# Patient Record
Sex: Female | Born: 1995 | Race: Black or African American | Hispanic: No | Marital: Single | Smoking: Never smoker
Health system: Southern US, Community
[De-identification: ages and names within clinical notes are randomized; demographics above are authoritative.]

## PROBLEM LIST (undated history)

## (undated) DIAGNOSIS — M352 Behcet's disease: Secondary | ICD-10-CM

## (undated) DIAGNOSIS — R519 Headache, unspecified: Secondary | ICD-10-CM

## (undated) DIAGNOSIS — R51 Headache: Secondary | ICD-10-CM

## (undated) DIAGNOSIS — A6920 Lyme disease, unspecified: Secondary | ICD-10-CM

## (undated) DIAGNOSIS — M797 Fibromyalgia: Secondary | ICD-10-CM

## (undated) DIAGNOSIS — M940 Chondrocostal junction syndrome [Tietze]: Secondary | ICD-10-CM

## (undated) DIAGNOSIS — M1990 Unspecified osteoarthritis, unspecified site: Secondary | ICD-10-CM

## (undated) HISTORY — DX: Headache: R51

## (undated) HISTORY — DX: Headache, unspecified: R51.9

---
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
## 2017-01-17 ENCOUNTER — Encounter: Payer: Self-pay | Admitting: Pediatrics

## 2017-01-30 ENCOUNTER — Encounter: Payer: Self-pay | Admitting: Pediatrics

## 2017-04-03 ENCOUNTER — Ambulatory Visit (INDEPENDENT_AMBULATORY_CARE_PROVIDER_SITE_OTHER): Admitting: Pediatrics

## 2017-04-03 ENCOUNTER — Encounter: Payer: Self-pay | Admitting: Pediatrics

## 2017-04-03 VITALS — BP 122/81 | HR 83 | Ht 58.27 in | Wt 108.0 lb

## 2017-04-03 DIAGNOSIS — N898 Other specified noninflammatory disorders of vagina: Secondary | ICD-10-CM

## 2017-04-03 DIAGNOSIS — L298 Other pruritus: Secondary | ICD-10-CM | POA: Diagnosis not present

## 2017-04-03 DIAGNOSIS — R358 Other polyuria: Secondary | ICD-10-CM

## 2017-04-03 DIAGNOSIS — Z113 Encounter for screening for infections with a predominantly sexual mode of transmission: Secondary | ICD-10-CM

## 2017-04-03 DIAGNOSIS — Z3202 Encounter for pregnancy test, result negative: Secondary | ICD-10-CM | POA: Diagnosis not present

## 2017-04-03 DIAGNOSIS — Z130 Encounter for screening for diseases of the blood and blood-forming organs and certain disorders involving the immune mechanism: Secondary | ICD-10-CM

## 2017-04-03 DIAGNOSIS — D500 Iron deficiency anemia secondary to blood loss (chronic): Secondary | ICD-10-CM

## 2017-04-03 DIAGNOSIS — N921 Excessive and frequent menstruation with irregular cycle: Secondary | ICD-10-CM | POA: Diagnosis not present

## 2017-04-03 DIAGNOSIS — R3589 Other polyuria: Secondary | ICD-10-CM

## 2017-04-03 LAB — TSH: TSH: 1.18 m[IU]/L (ref 0.50–4.30)

## 2017-04-03 LAB — T4, FREE: FREE T4: 1.2 ng/dL (ref 0.8–1.4)

## 2017-04-03 LAB — POCT URINALYSIS DIPSTICK
Bilirubin, UA: NEGATIVE
Glucose, UA: NEGATIVE
Ketones, UA: NEGATIVE
NITRITE UA: NEGATIVE
PH UA: 5 (ref 5.0–8.0)
PROTEIN UA: NEGATIVE
Spec Grav, UA: 1.02 (ref 1.030–1.035)
Urobilinogen, UA: NEGATIVE (ref ?–2.0)

## 2017-04-03 LAB — PLATELET FUNCTION ASSAY: COLLAGEN / EPINEPHRINE: 167 s (ref 0–193)

## 2017-04-03 LAB — POCT HEMOGLOBIN: Hemoglobin: 9.3 g/dL — AB (ref 12.2–16.2)

## 2017-04-03 LAB — POCT URINE PREGNANCY: Preg Test, Ur: NEGATIVE

## 2017-04-03 LAB — POCT RAPID HIV: Rapid HIV, POC: NEGATIVE

## 2017-04-03 NOTE — Patient Instructions (Signed)
We will get further information today about your periods through labs and getting more information from your primary care doctor  Come back and see [place] in 1 month or sooner if your bleeding gets heavier

## 2017-04-03 NOTE — Progress Notes (Signed)
THIS RECORD MAY CONTAIN CONFIDENTIAL INFORMATION THAT SHOULD NOT BE RELEASED WITHOUT REVIEW OF THE SERVICE PROVIDER.  Adolescent Medicine Consultation Initial Visit Susan George  is a 21 y.o. female referred by No ref. provider found here today for evaluation of irregular menses.      - Review of records?  yes  - Pertinent Labs? No  Growth Chart Viewed? yes   History was provided by the patient.  PCP Confirmed?  yes  My Chart Activated?   yes     Chief Complaint  Patient presents with  . New Evaluation  . reproductive health    HPI:    Since May of last year has been bleeding. Had started on depo shot (had 3 rounds) because thought periods weren't helping iron levels and didn't have period for a few months. After May it kept going. Was put on OCP and then estrogen alone but it didn't stop bleeding and she had chest pain with it and had an elevated d-dimer? but did not have clots.   Saw GYN and they didn't have much to offer with help. She is still bleeding every day although not as heavy. GYN said he didn't think that after ultrasound Mirena would be appropriate choice for her. End of last year. At [facility].   Menarche at 54. Cycles have never been regular. She used to bleed very heavily and has bled through clothes multiple times in her life.   Has still had lots of mouth ulcers. She had to have EMS come recently due to pain. She is still in school at [employer] and is taking a lighter load.   Never been sexually active. 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 Has been exclusively using pads.   Was seeing therapist on campus about twice a month but hasn't been since January. Spent 4 months at [facility] getting strong enough to walk again after she was ill in 11th grade-- reports she had Lyme disease.   No LMP recorded. Patient is not currently having periods (Reason: Irregular  Periods).  Review of Systems  Constitutional: Positive for fatigue.  HENT: Positive for mouth sores and postnasal drip.   Respiratory: Positive for shortness of breath.   Cardiovascular: Positive for chest pain. Negative for palpitations.  Gastrointestinal: Positive for abdominal pain, blood in stool and constipation. Negative for nausea and vomiting.  Endocrine: Positive for polyuria.  Genitourinary: Positive for genital sores. Negative for dysuria.  Musculoskeletal: Positive for arthralgias and myalgias.  Neurological: Positive for headaches. Negative for dizziness.    Allergies  Allergen Reactions  . Strawberry Flavor Anaphylaxis   Outpatient Medications Prior to Visit  Medication Sig Dispense Refill  . amitriptyline (ELAVIL) 25 MG tablet Take 25 mg by mouth at bedtime.    . colchicine 0.6 MG tablet Take 1 tablet (0.6 mg total) by mouth 2 (two) times daily. 14 tablet 0  . dexamethasone 0.5 MG/5ML elixir Rinse with 5 mL for 2 minutes and then spit out. Perform 4 times daily until lesions resolve.    . docusate sodium (COLACE) 100 MG capsule Take 100 mg by mouth daily.    . DULoxetine (CYMBALTA) 60 MG capsule Take 60 mg by mouth at bedtime.    . ferrous sulfate 325 (65 FE) MG tablet Take 325 mg by mouth 3 (three) times daily.    Marland Kitchen ibuprofen (ADVIL,MOTRIN) 600 MG tablet Take 600 mg by  mouth every 6 (six) hours as needed for headache or moderate pain.    . magnesium 30 MG tablet Take 30 mg by mouth every evening.    . ondansetron (ZOFRAN-ODT) 4 MG disintegrating tablet Take 4 mg by mouth every 8 (eight) hours as needed for nausea or vomiting.    Marland Kitchen oxyCODONE-acetaminophen (PERCOCET/ROXICET) 5-325 MG tablet Take 2 tablets by mouth every 6 (six) hours as needed for severe pain. 20 tablet 0  . polyethylene glycol (MIRALAX / GLYCOLAX) packet Take 17 g by mouth daily.    . predniSONE (DELTASONE) 20 MG tablet Take 2 tablets (40 mg total) by mouth daily with breakfast. 7 tablet 0  . PREMARIN  0.9 MG tablet Take 0.9 mg by mouth at bedtime.    . vitamin C (ASCORBIC ACID) 500 MG tablet Take 500 mg by mouth 3 (three) times daily.    . Vitamin D, Ergocalciferol, (DRISDOL) 50000 units CAPS capsule Take 50,000 Units by mouth once a week.    . lansoprazole (PREVACID) 30 MG capsule Take 60 mg by mouth daily at 12 noon.     No facility-administered medications prior to visit.      Patient Active Problem List   Diagnosis Date Noted  . Chronic pain syndrome 08/20/2016  . Iron deficiency anemia 08/20/2016  . Menorrhagia 08/20/2016  . Polyarthralgia   . Tachycardia 08/16/2016  . Sinus tachycardia 08/16/2016  . 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 Pain in the joints 08/16/2016  . Back pain 08/16/2016  . Chest pain 08/16/2016  . SIRS (systemic inflammatory response syndrome) (HCC) 08/16/2016    Past Medical History:  Reviewed and updated?  yes Past Medical History:  Diagnosis Date  . Arthritis   . Behcet's syndrome (HCC)   . Costochondritis   . Fibromyalgia   . Lyme disease     Family History: Reviewed and updated? Yes No family history on file.  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  Tobacco?  no Drugs/ETOH?  no Partner preference?  female Sexually Active?  no  Pregnancy Prevention:  condoms, reviewed condoms & plan B Trauma currently or in the pastt?  yes, chronic medical issues throughout high school and continued  Suicidal or Self-Harm thoughts?   no  The following portions of the patient's history were reviewed and updated as appropriate: allergies, current medications, past family history, past medical history, past social history and problem list.  Physical Exam:  Vitals:   04/03/17  0826  BP: 122/81  Pulse: 83  Weight: 108 lb (49 kg)  Height: 4' 10.27" (1.48 m)   BP 122/81 (BP Location: Right Arm, Patient Position: Sitting, Cuff Size: Normal)   Pulse 83   Ht 4' 10.27" (1.48 m)   Wt 108 lb (49 kg)   BMI 22.36 kg/m  Body mass index: body mass index is 22.36 kg/m. Blood pressure percentiles are 92 % systolic and 95 % diastolic based on NHBPEP's 4th Report. Blood pressure percentile targets: 90: 121/77, 95: 124/81, 99 + 5 mmHg: 137/94.   Physical Exam  Constitutional: She appears well-developed. No distress.  HENT:  Mouth/Throat: Oropharynx is clear and moist. Oral lesions present.  Neck: No thyromegaly present.  Thyroid seems somewhat enlarged but difficult to differentiate  with large strap muscles. Some TTP over isthmus.   Cardiovascular: Normal rate and regular rhythm.   No murmur heard. Pulmonary/Chest: Breath sounds normal.  Abdominal: Soft. She exhibits no mass. There is no tenderness. There is no guarding.  Genitourinary: There is no rash on the right labia. There is no rash on the left labia. There is bleeding in the vagina.  Genitourinary Comments: Normal clitoral size   Musculoskeletal: She exhibits no edema.  Lymphadenopathy:    She has no cervical adenopathy.  Neurological: She is alert.  Skin: Skin is warm. No rash noted.  Psychiatric: She has a normal mood and affect.  Nursing note and vitals reviewed.  Assessment/Plan: 1. Menometrorrhagia Will obtain more complete hormonal and bleeding workup today. Will attempt to get ultrasound results from PCP as she reports they will likely be very hard to get from [facility]. She continues to have light daily bleeding but reports that she isn't sure if she wants to try any other hormonal therapy at this time. Would recommend Mirena but would look at ultrasound results first to ensure no contraindication. Could consider norethindrone alone. Could also consider GnRH agonist if hormonal options are exhausted.  -  Prolactin - LH - Follicle stimulating hormone - Testos,Total,Free and SHBG (Female) - TSH - T4, free - DHEA-sulfate - Platelet function assay - Von Willebrand Factor Multimer  2. Polyuria Negative for glucose or ketones.  - POCT urinalysis dipstick  3. Vaginal itching Will do wet prep to ensure no yeast or BV with ongoing pad use.  - WET PREP BY MOLECULAR PROBE  4. Iron deficiency anemia due to chronic blood loss Hemoglobin improved to 9.3 today. Last ferritin in 11/2016 was 2.7. Will reassess today.  - Ferritin - Platelet function assay - Von Willebrand Factor Multimer  5. Screening for iron deficiency anemia 9.3.  - POCT hemoglobin  6. Pregnancy examination or test, negative result Per protocol. Negative.  - POCT urine pregnancy  7. Routine screening for STI (sexually transmitted infection) Per protocol.  - GC/Chlamydia Probe Amp - POCT Rapid HIV   Follow-up:  4 weeks for further review of labs and ultrasound report   Medical decision-making:  >60 minutes spent face to face with patient with more than 50% of appointment spent discussing diagnosis, management, follow-up, and reviewing of multiple chronic medical conditions, menometrorrhagia, iron deficiency anemia and treatment options.   CC: Dr. Marita Snellen, Dr. Madaline Guthrie

## 2017-04-04 ENCOUNTER — Telehealth: Payer: Self-pay | Admitting: Pediatrics

## 2017-04-04 LAB — FOLLICLE STIMULATING HORMONE: FSH: 4.5 m[IU]/mL

## 2017-04-04 LAB — GC/CHLAMYDIA PROBE AMP
CT PROBE, AMP APTIMA: NOT DETECTED
GC Probe RNA: NOT DETECTED

## 2017-04-04 LAB — FERRITIN: Ferritin: 4 ng/mL — ABNORMAL LOW (ref 6–67)

## 2017-04-04 LAB — DHEA-SULFATE: DHEA SO4: 77 ug/dL (ref 51–321)

## 2017-04-04 LAB — LUTEINIZING HORMONE: LH: 4.8 m[IU]/mL

## 2017-04-04 LAB — PROLACTIN: Prolactin: 6.8 ng/mL

## 2017-04-04 NOTE — Telephone Encounter (Signed)
Call to [facility] to attempt to get records for pelvic ultrasound that patient had performed at [facility]. Had to leave message with the RN-- left voicemail with phone number and clinic fax number.

## 2017-04-05 ENCOUNTER — Telehealth: Payer: Self-pay | Admitting: Pediatrics

## 2017-04-05 LAB — WET PREP BY MOLECULAR PROBE
CANDIDA SPECIES: NOT DETECTED
Gardnerella vaginalis: DETECTED — AB
TRICHOMONAS VAG: NOT DETECTED

## 2017-04-05 LAB — TESTOS,TOTAL,FREE AND SHBG (FEMALE)
Sex Hormone Binding Glob.: 44 nmol/L (ref 17–124)
TESTOSTERONE,FREE: 0.7 pg/mL (ref 0.1–6.4)
TESTOSTERONE,TOTAL,LC/MS/MS: 8 ng/dL (ref 2–45)

## 2017-04-05 NOTE — Telephone Encounter (Signed)
Nurse from [facility] returned call from [facility]. She states that in order for [place] to get records from the pelvic ultrasound that patient had performed at [facility], they will need an ROI signed by the patient faxed to them at [phone].

## 2017-04-07 NOTE — Telephone Encounter (Signed)
Spoke with Patient and let her know that we need her to come in and sign an ROI so we may obtain the reults of her Pelvic US from another office. Patient stated understanding and states she will come in at her earliest convenience.

## 2017-04-07 NOTE — Telephone Encounter (Signed)
Received another call from RN stating they need signed ROI.Routing to pod pool.

## 2017-04-10 LAB — VON WILLEBRAND FACTOR MULTIMERI
FACTOR-VIII ACTIVITY: 139 % (ref 50–180)
Ristocetin Co-Factor: 76 % (ref 42–200)
VON WILLEBRAND FACTOR AG: 93 % (ref 50–217)

## 2017-04-13 ENCOUNTER — Other Ambulatory Visit: Payer: Self-pay | Admitting: Pediatrics

## 2017-04-13 ENCOUNTER — Encounter: Payer: Self-pay | Admitting: Pediatrics

## 2017-04-13 MED ORDER — METRONIDAZOLE 500 MG PO TABS: 500.0000 mg | ORAL_TABLET | Freq: Two times a day (BID) | ORAL | 0 refills | Status: DC

## 2017-05-02 ENCOUNTER — Encounter: Payer: Self-pay | Admitting: Pediatrics

## 2017-05-02 ENCOUNTER — Ambulatory Visit (INDEPENDENT_AMBULATORY_CARE_PROVIDER_SITE_OTHER): Admitting: Pediatrics

## 2017-05-02 VITALS — BP 139/85 | HR 102 | Ht 58.47 in | Wt 105.2 lb

## 2017-05-02 DIAGNOSIS — N921 Excessive and frequent menstruation with irregular cycle: Secondary | ICD-10-CM | POA: Diagnosis not present

## 2017-05-02 DIAGNOSIS — D500 Iron deficiency anemia secondary to blood loss (chronic): Secondary | ICD-10-CM | POA: Insufficient documentation

## 2017-05-02 DIAGNOSIS — K5900 Constipation, unspecified: Secondary | ICD-10-CM

## 2017-05-02 LAB — POCT HEMOGLOBIN: HEMOGLOBIN: 9.2 g/dL — AB (ref 12.2–16.2)

## 2017-05-02 NOTE — Assessment & Plan Note (Signed)
Chronic constipation in setting of narcotic use. She does have miralax and colace, and was counseled to take them as prescribed as she has not ben doing so

## 2017-05-02 NOTE — Assessment & Plan Note (Signed)
Hgb 9.2 today from 9.3 at last check. Continues to be anemic in the setting of menorrhagia.

## 2017-05-02 NOTE — Assessment & Plan Note (Signed)
Continue ferrous sulfate TID given continued blood loss from menorrhagia

## 2017-05-02 NOTE — Assessment & Plan Note (Addendum)
Menometrorrhagia - continued daily bleeding in the setting of not taking any hormonal medications. She was previously managed with OCPs in past and there was some concern that they flared her fibromyalgia. Hgb 9.2 today with continued dizziness and fatigue. She had a pelvic US in 11/2016 at [facility], the results of which we awaiting. A progesterone IUD like Mirena would appear to be a good option to control her bleeding assuming she does not have an anatomical contraindication to its placement. She does have significant anxiety surrounding its placement given she is virginal and has a history of a difficult experience with pelvic exams surrounding previous vaginal manifestations of her Bechets. We will wait for the US results and if wnl will [facility] for [facility]rena [facility] under sedation. This has been discussed with the patient and her mother who was present and they are in agreenment

## 2017-05-02 NOTE — Patient Instructions (Signed)
We will get you connected with Dr. Providence LaniusHowell at [facility] for IUD placement under sedation. I will coordinate this with Dr. Marina GoodellPerry this week. I will call you if anything unexpected is on the ultrasound.  [phone]) [phone]

## 2017-05-02 NOTE — Progress Notes (Signed)
10:45 AM  Adolescent Medicine Consultation Follow-Up Visit Susan George  is a 21 y.o. female referred by no referring provider found here today for follow-up of menometrorrhagia     History was provided by the patient and mother.   Growth Chart Viewed? yes  HPI:  Pt reports continued daily bleeding. She is not currently on a hormonal method to control bleeding. Her bleeding has varying degrees of heaviness. Yesterday she felt she had relatively light bleeding and had to change her pad every 2 hours. She uses super maxi pad sand when she changes her pads they are full. She reports daily dizziness and fatigue. Her dizziness is worse when she stands quickly. Two weeks ago after standing quickly she developed dizziness and fell. She is not sure if she lost consciousness.  She also reports abdominal pain that changes location which is chronic in nature, however she does have constipation chronically as well. She typically has a bowel movement every 2-3 days. Her last bowel movement was 5 days ago. She does take percocet for her fibromyalgia. She has miralax an colace which she occasionally forgets to take  No LMP recorded. Patient is not currently having periods (Reason: Irregular Periods).  ROS:   Constitutional: Positive for fatigue.  HENT: negative for post nasal drip Respiratory: negtaive for shortness of breath.   Cardiovascular: Positive for chronic chest pain. Negative for palpitations.  Gastrointestinal: Positive for abdominal pain, and constipation and nausea. Negative for vomiting.  Genitourinary: Negative for genital sores. Negative for dysuria.  Musculoskeletal: Positive for arthralgias and myalgias.  Neurological: Negative for headaches. Positive for dizziness  Allergies  Allergen Reactions  . Strawberry Flavor Anaphylaxis    Social History: Sleep:  Sleeps 7-8 hours per night Eating Habits: no issues with eaating Exercise: taking more walks School: [employer] Future Plans:  Medical school  Confidentiality was discussed with the patient and if applicable, with caregiver as well.  Tobacco? no Secondhand smoke exposure?no Drugs/EtOH?no Sexually active?no Pregnancy Prevention: reviewed condoms Safe at home, in school & in relationships? Yes Guns in the home? no Safe to self? Yes  Physical Exam:  Vitals:   05/02/17 1038  BP: 139/85  Pulse: (!) 102  Weight: 105 lb 3.2 oz (47.7 kg)  Height: 4' 10.47" (1.485 m)   BP 139/85 (BP Location: Right Arm, Patient Position: Sitting, Cuff Size: Small)   Pulse (!) 102   Ht 4' 10.47" (1.485 m)   Wt 105 lb 3.2 oz (47.7 kg)   BMI 21.64 kg/m  Body mass index: body mass index is 21.64 kg/m. Blood pressure percentiles are >99 % systolic and 98 % diastolic based on NHBPEP's 4th Report. Blood pressure percentile targets: 90: 121/77, 95: 124/81, 99 + 5 mmHg: 137/94.  Physical Exam Physical Exam  Constitutional: She appears well-developed. No distress.  HENT:  Mouth/Throat: Oropharynx is clear and moist.  Neck: No thyromegaly present.  Thyroid appears enlarged but difficult to differentiate with large strap muscles. No tenderness to palpation over thyroid Cardiovascular: Normal rate and regular rhythm.   No murmur heard. Pulmonary/Chest: Breath sounds normal.  Abdominal: Soft. She exhibits no mass. There is no tenderness. There is no guarding. .   Musculoskeletal: She exhibits no edema.  Lymphadenopathy:    She has no cervical adenopathy.  Neurological: She is alert.  Skin: Skin is warm. No rash noted.  Psychiatric: She has a normal mood and affect.  Nursing note and vitals reviewed.   Assessment/Plan:  1. Menometrorrhagia - continued daily bleeding in the  setting of not taking any hormonal medications. She was previously managed with OCPs in past and there was some concern that they flared her fibromyalgia. Hgb 9.2 today with continued dizziness and fatigue. She had a pelvic US in 11/2016 at [facility], the results of which we awaiting. A progesterone IUD like Mirena would appear to be a good option to control her bleeding assuming she does not have an anatomical contraindication to its placement. She does have significant anxiety surrounding its placement given she is virginal and has a history of a difficult experience with pelvic exams surrounding previous vaginal manifestations of her Bechets. We will wait for the [place] results and if wnl will refer to [facility] for Mirena placement under sedation. This has been discussed with the patient and her mother who was present and they are in agreenment  2. Anemia due to chronic blood loss Hgb 9.2 today from 9.3 at last check. Continues to be anemic in the setting of menorrhagia.   3. Iron deficiency Continue ferrous sulfate TID given continued blood loss from menorrhagia  4. Chronic Constipation Chronic constipation in setting of narcotic use. She does have miralax and colace, and was counseled to take them as prescribed as she has not ben doing so  Follow-up:  Follow up with [facility] gynecology  Medical decision-making:  > 30 minutes spent, more than 50% of appointment was spent discussing diagnosis and management of symptoms  Joscelin Fray A. Kennon Rounds MD, MS Family Medicine Resident PGY-3 Pager [phone]

## 2017-08-15 ENCOUNTER — Ambulatory Visit (INDEPENDENT_AMBULATORY_CARE_PROVIDER_SITE_OTHER): Admitting: Pediatrics

## 2017-08-15 ENCOUNTER — Encounter: Payer: Self-pay | Admitting: Pediatrics

## 2017-08-15 VITALS — BP 128/81 | HR 122 | Ht 58.07 in | Wt 107.0 lb

## 2017-08-15 DIAGNOSIS — R21 Rash and other nonspecific skin eruption: Secondary | ICD-10-CM | POA: Diagnosis not present

## 2017-08-15 DIAGNOSIS — N921 Excessive and frequent menstruation with irregular cycle: Secondary | ICD-10-CM

## 2017-08-15 DIAGNOSIS — Z130 Encounter for screening for diseases of the blood and blood-forming organs and certain disorders involving the immune mechanism: Secondary | ICD-10-CM | POA: Diagnosis not present

## 2017-08-15 DIAGNOSIS — D500 Iron deficiency anemia secondary to blood loss (chronic): Secondary | ICD-10-CM

## 2017-08-15 DIAGNOSIS — K582 Mixed irritable bowel syndrome: Secondary | ICD-10-CM | POA: Diagnosis not present

## 2017-08-15 DIAGNOSIS — Z975 Presence of (intrauterine) contraceptive device: Secondary | ICD-10-CM | POA: Diagnosis not present

## 2017-08-15 LAB — POCT HEMOGLOBIN: Hemoglobin: 10.5 g/dL — AB (ref 12.2–16.2)

## 2017-08-15 NOTE — Progress Notes (Signed)
THIS RECORD MAY CONTAIN CONFIDENTIAL INFORMATION THAT SHOULD NOT BE RELEASED WITHOUT REVIEW OF THE SERVICE PROVIDER.  Adolescent Medicine Consultation Follow-Up Visit Susan George  is a 21 y.o. female referred by No ref. provider found here today for follow-up regarding IUD placement.    Pertinent Labs? Yes Results for orders placed or performed in visit on 08/15/17  POCT hemoglobin  Result Value Ref Range   Hemoglobin 10.5 (A) 12.2 - 16.2 g/dL  (increased from 9.2 3 months ago)   History was provided by the patient.  Interpreter? no  PCP Confirmed?  yes  My Chart Activated?   yes   Chief Complaint  Patient presents with  . Follow-up    still having irregular bleeding, pt would like left nipple assessed, rash on face    HPI:    Still having bleeding with IUD in place. It's not super heavy but not light. Now soaking half of a pad about every 2 hours. Cramping comes and goes but can be really sharp and extreme. Has taken premarin 0.9 mg previously. Was hesitant to try estrogen containing medication again because of chest pain that occurred with taking the premarin but is willing to consider trying it again at a lower dose.  Rash on face, ?what is causing it, started beginning of August, had been out in the sun Uses charcoal face wash, using cream from dematologist (put it on at night). Concerned that rash might be due ot IUD.    No LMP recorded. Patient is not currently having periods (Reason: Irregular Periods). Allergies  Allergen Reactions  . Strawberry Flavor Anaphylaxis   Outpatient Medications Prior to Visit  Medication Sig Dispense Refill  . amitriptyline (ELAVIL) 25 MG tablet Take 25 mg by mouth at bedtime.    Marland Kitchen dexamethasone 0.5 MG/5ML elixir Rinse with 5 mL for 2 minutes and then spit out. Perform 4 times daily until lesions resolve.    . dexamethasone 0.5 MG/5ML elixir Rinse with 5 mL for 2 minutes and then spit out. Perform 4 times daily until lesions resolve.     . docusate sodium (COLACE) 100 MG capsule Take 100 mg by mouth daily.    . DULoxetine (CYMBALTA) 60 MG capsule Take 60 mg by mouth at bedtime.    . ferrous sulfate 325 (65 FE) MG tablet Take 325 mg by mouth 3 (three) times daily.    . ferrous sulfate 325 (65 FE) MG tablet Take 325 mg by mouth.    Marland Kitchen ibuprofen (ADVIL,MOTRIN) 600 MG tablet Take 600 mg by mouth every 6 (six) hours as needed for headache or moderate pain.    . magnesium 30 MG tablet Take 30 mg by mouth every evening.    . ondansetron (ZOFRAN-ODT) 4 MG disintegrating tablet Take 4 mg by mouth every 8 (eight) hours as needed for nausea or vomiting.    Marland Kitchen oxyCODONE-acetaminophen (PERCOCET/ROXICET) 5-325 MG tablet Take 2 tablets by mouth every 6 (six) hours as needed for severe pain. 20 tablet 0  . polyethylene glycol (MIRALAX / GLYCOLAX) packet Take 17 g by mouth daily.    . vitamin C (ASCORBIC ACID) 500 MG tablet Take 500 mg by mouth 3 (three) times daily.    . Vitamin D, Ergocalciferol, (DRISDOL) 50000 units CAPS capsule Take 50,000 Units by mouth once a week.    . metroNIDAZOLE (FLAGYL) 500 MG tablet Take 1 tablet (500 mg total) by mouth 2 (two) times daily. (Patient not taking: Reported on 08/15/2017) 14 tablet 0   No  facility-administered medications prior to visit.      Patient Active Problem List   Diagnosis Date Noted  . Anemia due to chronic blood loss 05/02/2017  . Constipation 05/02/2017  . Chronic pain syndrome 08/20/2016  . Iron deficiency anemia 08/20/2016  . Menorrhagia 08/20/2016  . Polyarthralgia   . Tachycardia 08/16/2016  . Sinus tachycardia 08/16/2016  . 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 Pain in the joints 08/16/2016  . Back pain 08/16/2016  . Chest pain 08/16/2016  . SIRS (systemic inflammatory response syndrome) (HCC) 08/16/2016    Social History:  Confidentiality was discussed with the patient and if applicable, with caregiver as well.  Changes at home or school since last visit:  no Not  sexually active, no tobacco, drugs or EtOH.   The following portions of the patient's history were reviewed and updated as appropriate: allergies, current medications, past social history and problem list.  Physical Exam:  Vitals:   08/15/17 1525  BP: 128/81  Pulse: (!) 122  Weight: 107 lb (48.5 kg)  Height: 4' 10.07" (1.475 m)   BP 128/81   Pulse (!) 122   Ht 4' 10.07" (1.475 m)   Wt 107 lb (48.5 kg)   BMI 22.31 kg/m  Body mass index: body mass index is 22.31 kg/m. Blood pressure percentiles are 95 % systolic and 96 % diastolic based on the August 2017 AAP Clinical Practice Guideline. Blood pressure percentile targets: 90: 123/76, 95: 128/80, 95 + 12 mmHg: 140/92. This reading is in the Stage 1 hypertension range (BP >= 130/80).  Physical Exam  Constitutional: She appears well-nourished. No distress.  Abdominal: Soft. She exhibits no mass. There is no tenderness. There is no guarding.  Skin: Skin is warm.  Dry patches on face   Assessment/Plan: 21 yo female with complex history of chronic pain, now much improved. Long h/o menorrhagia with slight improvement with IUD in place but still persistent bleeding. Reviewed normal ultrasound yesterday. Reviewed multiple labs in past do not identify endocrinopathy or blood dyscrasia. Discussed with patient whether to consider trial of lower dose of premarin that previously tried. Consider hematology consultation in future given how difficulty bleeding has been to control although no evidence of easy bleeding otherwise. Pt agreed to plan outlined in AVS. - consider trial of diva cup and retrying tampons to limit frequency of pad use - consider low dose OCP (10 mcg EE) or 0.3 mg premarin in future - restart lysteda if recurrence of heavy bleeding - continue iron supplementation  Face rash noted not likely due to IUD. Reassured patient and made some recommendations regarding product use.   Reviewed IBS management.  Discussed relaxation  management and patient plans to consider yoga as well as trying some mental health apps.  Follow-up:  Return in about 1 month (around 09/15/2017) for Follow-up, , with Dr. Marina Goodell.   Medical decision-making:  >30 minutes spent face to face with patient with more than 50% of appointment spent discussing diagnosis, management, follow-up, and reviewing of menorrhagia, face rahs, IBS, stress.

## 2017-08-15 NOTE — Patient Instructions (Addendum)
Consider trying the diva cup or trying tampons again instead of having to change pads every 2 hours.  [email or web address]  Please find out name of birth control pill that you tried previously. We could consider a much lower dose of estrogen or birth control.  Consider taking lysteda again if things get heavier  In terms of the face rash, please send me the name of the cream you have been using. Send that to me through the [facility] my chart.  For now use: Use cetaphil face cleanser (or generic version) twice daily instead of charcoal Apply moisturizer after you cleanse  Consider trying a small amount of miralax every day to avoid the alternating diarrhea and constipation  I definitely support going to [employer]!   Mental Health Apps and Websites Here are a few free apps meant to help you to help yourself.  To find, try searching on the internet to see if the app is offered on Apple/Android devices. If your first choice doesn't come up on your device, the good news is that there are many choices! Play around with different apps to see which ones are helpful to you . Calm This is an app meant to help increase calm feelings. Includes info, strategies, and tools for tracking your feelings.   Calm Harm  This app is meant to help with self-harm. Provides many 5-minute or 15-min coping strategies for doing instead of hurting yourself.    Healthy Minds Health Minds is a problem-solving tool to help deal with emotions and cope with stress you encounter wherever you are.    MindShift This app can help people cope with anxiety. Rather than trying to avoid anxiety, you can make an important shift and face it.    MY3  MY3 features a support system, safety plan and resources with the goal of offering a tool to use in a time of need.    My Life My Voice  This mood journal offers a simple solution for tracking your thoughts, feelings and moods. Animated emoticons can help identify your mood.   Relax  Melodies Designed to help with sleep, on this app you can mix sounds and meditations for relaxation.    Smiling Mind Smiling Mind is meditation made easy: it's a simple tool that helps put a smile on your mind.    Stop, Breathe & Think  A friendly, simple guide for people through meditations for mindfulness and compassion.  Stop, Breathe and Think Kids Enter your current feelings and choose a "mission" to help you cope. Offers videos for certain moods instead of just sound recordings.     The [employer] The [employer] (V[employer]) contains simple tools to help patients with coping, relaxation, distraction, and positive thinking.

## 2017-09-19 ENCOUNTER — Encounter: Payer: Self-pay | Admitting: Family

## 2017-09-19 ENCOUNTER — Ambulatory Visit (INDEPENDENT_AMBULATORY_CARE_PROVIDER_SITE_OTHER): Admitting: Family

## 2017-09-19 VITALS — BP 129/82 | HR 116 | Ht <= 58 in | Wt 109.8 lb

## 2017-09-19 DIAGNOSIS — Z8349 Family history of other endocrine, nutritional and metabolic diseases: Secondary | ICD-10-CM | POA: Diagnosis not present

## 2017-09-19 DIAGNOSIS — N921 Excessive and frequent menstruation with irregular cycle: Secondary | ICD-10-CM

## 2017-09-19 DIAGNOSIS — Z975 Presence of (intrauterine) contraceptive device: Secondary | ICD-10-CM

## 2017-09-19 DIAGNOSIS — Z130 Encounter for screening for diseases of the blood and blood-forming organs and certain disorders involving the immune mechanism: Secondary | ICD-10-CM

## 2017-09-19 LAB — TSH: TSH: 0.92 m[IU]/L

## 2017-09-19 LAB — POCT HEMOGLOBIN: HEMOGLOBIN: 9.9 g/dL — AB (ref 12.2–16.2)

## 2017-09-19 LAB — T4, FREE: FREE T4: 1.2 ng/dL (ref 0.8–1.4)

## 2017-09-19 NOTE — Patient Instructions (Signed)
I will call you with lab results today.  Return in 2 months or sooner as needed.

## 2017-09-19 NOTE — Progress Notes (Signed)
THIS RECORD MAY CONTAIN CONFIDENTIAL INFORMATION THAT SHOULD NOT BE RELEASED WITHOUT REVIEW OF THE SERVICE PROVIDER.  Adolescent Medicine Consultation Follow-Up Visit Susan George  is a 21 y.o. female referred by No ref. provider found here today for follow-up regarding BTB with IUD.  Last seen in [facility] on 08/15/17 for same.  Plan at last visit included improving symptoms.   Pertinent Labs? No Growth Chart Viewed? no   History was provided by the patient.  Interpreter? no  PCP Confirmed?  no  My Chart Activated?   yes    Chief Complaint  Patient presents with  . Follow-up    HPI:      -Still bleeding every day somewhat -Definitely not as heavy as it was.  -Still taking iron supplement TID.  -Not taking Lysteda; wants to discuss it today; feels she had chest pain  -Daily HAs, improved with Tylenol. Was told to switch from ibu to apap for that.  [employer] on [employer]. Sleep terrible. Appetite fluctuates.   Review of Systems  Constitutional: Negative for malaise/fatigue.  Eyes: Negative for double vision.  Respiratory: Negative for shortness of breath.   Cardiovascular: Negative for chest pain and palpitations.  Gastrointestinal: Negative for abdominal pain, constipation, diarrhea, nausea and vomiting.  Genitourinary: Negative for dysuria.  Musculoskeletal: Negative for joint pain and myalgias.  Skin: Negative for rash.  Neurological: Positive for headaches. Negative for dizziness.  Endo/Heme/Allergies: Does not bruise/bleed easily.    No LMP recorded. Patient is not currently having periods (Reason: Irregular Periods). Allergies  Allergen Reactions  . Strawberry Flavor Anaphylaxis   Outpatient Medications Prior to Visit  Medication Sig Dispense Refill  . amitriptyline (ELAVIL) 25 MG tablet Take 25 mg by mouth at bedtime.    Marland Kitchen dexamethasone 0.5 MG/5ML elixir Rinse with 5 mL for 2 minutes and then spit out. Perform 4 times daily until  lesions resolve.    . dexamethasone 0.5 MG/5ML elixir Rinse with 5 mL for 2 minutes and then spit out. Perform 4 times daily until lesions resolve.    . docusate sodium (COLACE) 100 MG capsule Take 100 mg by mouth daily.    . DULoxetine (CYMBALTA) 60 MG capsule Take 60 mg by mouth at bedtime.    . ferrous sulfate 325 (65 FE) MG tablet Take 325 mg by mouth 3 (three) times daily.    . ferrous sulfate 325 (65 FE) MG tablet Take 325 mg by mouth.    Marland Kitchen ibuprofen (ADVIL,MOTRIN) 600 MG tablet Take 600 mg by mouth every 6 (six) hours as needed for headache or moderate pain.    . magnesium 30 MG tablet Take 30 mg by mouth every evening.    . naproxen (NAPROSYN) 500 MG tablet Take 500 mg by mouth as needed.    . ondansetron (ZOFRAN-ODT) 4 MG disintegrating tablet Take 4 mg by mouth every 8 (eight) hours as needed for nausea or vomiting.    Marland Kitchen oxyCODONE-acetaminophen (PERCOCET/ROXICET) 5-325 MG tablet Take 2 tablets by mouth every 6 (six) hours as needed for severe pain. 20 tablet 0  . polyethylene glycol (MIRALAX / GLYCOLAX) packet Take 17 g by mouth daily.    . vitamin C (ASCORBIC ACID) 500 MG tablet Take 500 mg by mouth 3 (three) times daily.    . Vitamin D, Ergocalciferol, (DRISDOL) 50000 units CAPS capsule Take 50,000 Units by mouth once a week.    . metroNIDAZOLE (FLAGYL) 500 MG tablet Take 1 tablet (500 mg total) by mouth 2 (  two) times daily. 14 tablet 0   No facility-administered medications prior to visit.      Patient Active Problem List   Diagnosis Date Noted  . Irritable bowel syndrome with both constipation and diarrhea 08/15/2017  . Rash of face 08/15/2017  . Anemia due to chronic blood loss 05/02/2017  . Chronic pain syndrome 08/20/2016  . Iron deficiency anemia 08/20/2016  . Menorrhagia 08/20/2016  . Polyarthralgia   . Sinus tachycardia 08/16/2016  . 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 Back pain 08/16/2016  . Chest pain 08/16/2016  . SIRS (systemic inflammatory response syndrome)  (HCC) 08/16/2016     Physical Exam:  Vitals:   09/19/17 1330  BP: 129/82  Pulse: (!) 116  Weight: 109 lb 12.8 oz (49.8 kg)  Height:  (1.473 m)   BP 129/82 (BP Location: Right Arm, Patient Position: Sitting, Cuff Size: Normal)   Pulse (!) 116   Ht  (1.473 m)   Wt 109 lb 12.8 oz (49.8 kg)   BMI 22.95 kg/m  Body mass index: body mass index is 22.95 kg/m. Blood pressure percentiles are 96 % systolic and 97 % diastolic based on the August 2017 AAP Clinical Practice Guideline. Blood pressure percentile targets: 90: 123/76, 95: 128/80, 95 + 12 mmHg: 140/92. This reading is in the Stage 1 hypertension range (BP >= 130/80).  Lab Results  Component Value Date   HGB 9.9 (A) 09/19/2017   Wt Readings from Last 3 Encounters:  09/19/17 109 lb 12.8 oz (49.8 kg) (16 %, Z= -1.02)*  08/15/17 107 lb (48.5 kg) (11 %, Z= -1.21)*  05/02/17 105 lb 3.2 oz (47.7 kg) (9 %, Z= -1.32)*   * Growth percentiles are based on CDC 2-20 Years data.   Physical Exam  Constitutional: She is oriented to person, place, and time. She appears well-developed. No distress.  HENT:  Head: Normocephalic and atraumatic.  Eyes: Pupils are equal, round, and reactive to light. EOM are normal. No scleral icterus.  Neck: Normal range of motion. Neck supple. No thyromegaly present.  Cardiovascular: Normal rate, regular rhythm, normal heart sounds and intact distal pulses.   No murmur heard. Pulmonary/Chest: Effort normal and breath sounds normal.  Abdominal: Soft.  Musculoskeletal: Normal range of motion. She exhibits no edema.  Lymphadenopathy:    She has no cervical adenopathy.  Neurological: She is alert and oriented to person, place, and time. No cranial nerve deficit.  Skin: Skin is warm and dry. No rash noted.  Psychiatric: She has a normal mood and affect. Her behavior is normal. Judgment and thought content normal.   Assessment/Plan: 1. Family history of thyroid disease in mother -will check thyroid  since sleep, HR and bleeding.  - TSH - T4, free  2. Screening for iron deficiency anemia Lab Results  Component Value Date   HGB 9.9 (A) 09/19/2017   - POCT hemoglobin - concerning; needs to continue with iron supplement.  -imaging if labs inconclusive.   3. Breakthrough bleeding with IUD -will check prolactin; consider imaging; iron supplement. Return precautions - Prolactin   Follow-up:  Return in about 2 months (around 11/19/2017). - will need sooner follow up - one month. Call to schedule.   Medical decision-making:  >15 minutes spent face to face with patient with more than 50% of appointment spent discussing diagnosis, management, follow-up, and reviewing bleeding and symptoms.

## 2017-09-20 LAB — PROLACTIN: PROLACTIN: 10.7 ng/mL

## 2017-09-27 ENCOUNTER — Encounter (HOSPITAL_COMMUNITY): Payer: Self-pay

## 2017-09-27 ENCOUNTER — Emergency Department (HOSPITAL_COMMUNITY)
Admission: EM | Admit: 2017-09-27 | Discharge: 2017-09-28 | Disposition: A | Discharge status: Home / Self Care | Attending: Emergency Medicine | Admitting: Emergency Medicine

## 2017-09-27 DIAGNOSIS — N770 Ulceration of vulva in diseases classified elsewhere: Secondary | ICD-10-CM | POA: Diagnosis not present

## 2017-09-27 DIAGNOSIS — M352 Behcet's disease: Secondary | ICD-10-CM | POA: Diagnosis not present

## 2017-09-27 DIAGNOSIS — Z79899 Other long term (current) drug therapy: Secondary | ICD-10-CM | POA: Diagnosis not present

## 2017-09-27 DIAGNOSIS — R102 Pelvic and perineal pain: Secondary | ICD-10-CM | POA: Diagnosis present

## 2017-09-27 NOTE — ED Triage Notes (Signed)
Pt BIB [facility] c/o lower abdominal and vaginal pain. She has been seeing an OBGYN due to excessive bleeding. Hx of anemia, Behcet's, Lyme Disease, and Fibromyalgia. No N/V/D.

## 2017-09-28 LAB — URINALYSIS, ROUTINE W REFLEX MICROSCOPIC
BACTERIA UA: NONE SEEN
Bilirubin Urine: NEGATIVE
GLUCOSE, UA: NEGATIVE mg/dL
KETONES UR: NEGATIVE mg/dL
Leukocytes, UA: NEGATIVE
NITRITE: NEGATIVE
PROTEIN: NEGATIVE mg/dL
Specific Gravity, Urine: 1.016 (ref 1.005–1.030)
Squamous Epithelial / LPF: NONE SEEN
pH: 6 (ref 5.0–8.0)

## 2017-09-28 LAB — WET PREP, GENITAL
Clue Cells Wet Prep HPF POC: NONE SEEN
Sperm: NONE SEEN
Trich, Wet Prep: NONE SEEN
Yeast Wet Prep HPF POC: NONE SEEN

## 2017-09-28 MED ORDER — TRIAMCINOLONE ACETONIDE 0.025 % EX OINT: 1.0000 "application " | TOPICAL_OINTMENT | Freq: Two times a day (BID) | CUTANEOUS | 0 refills | Status: DC

## 2017-09-28 MED ORDER — HYDROCODONE-ACETAMINOPHEN 5-325 MG PO TABS: 1.0000 | ORAL_TABLET | Freq: Four times a day (QID) | ORAL | 0 refills | Status: DC | PRN

## 2017-09-28 MED ORDER — LIDOCAINE 4 % EX CREA: 1.0000 "application " | TOPICAL_CREAM | Freq: Every day | CUTANEOUS | 0 refills | Status: DC | PRN

## 2017-09-28 MED ORDER — HYDROCODONE-ACETAMINOPHEN 5-325 MG PO TABS
1.0000 | ORAL_TABLET | Freq: Once | ORAL | Status: AC
  Administered 2017-09-28: 1 via ORAL
  Filled 2017-09-28: qty 1

## 2017-09-28 MED ORDER — NAPROXEN 500 MG PO TABS: 500.0000 mg | ORAL_TABLET | Freq: Two times a day (BID) | ORAL | 0 refills | Status: DC

## 2017-09-28 NOTE — ED Provider Notes (Signed)
[facility]-EMERGENCY DEPT Provider Note   CSN: [id number] Arrival date & time: 09/27/17  1842     History   Chief Complaint Chief Complaint  Patient presents with  . Pelvic Pain    HPI Susan George is a 21 y.o. female.  HPI Patient with history of Behcet's syndrome comes in with chief complaint of genital pain. Patient reports that her current pain started 1 day ago, she has taken Naprosyn with minimal relief. Patient is also having some vaginal bleeding. Patient just finished her period. Patient denies any vaginal discharge. Patient is having some pain with urination. No history of STDs or any risk factors for STDs. Patient reports that she has had genital ulcers due to her episodes, and the current symptoms appear to be consistent with her previous flareup.  Past Medical History:  Diagnosis Date  . Arthritis   . Behcet's syndrome (HCC)   . Costochondritis   . Fibromyalgia   . Headache   . Lyme disease     Patient Active Problem List   Diagnosis Date Noted  . Irritable bowel syndrome with both constipation and diarrhea 08/15/2017  . Rash of face 08/15/2017  . Anemia due to chronic blood loss 05/02/2017  . Chronic pain syndrome 08/20/2016  . Iron deficiency anemia 08/20/2016  . Menorrhagia 08/20/2016  . Polyarthralgia   . Sinus tachycardia 08/16/2016  . 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 Back pain 08/16/2016  . Chest pain 08/16/2016  . SIRS (systemic inflammatory response syndrome) (HCC) 08/16/2016    History reviewed. No pertinent surgical history.  OB History    Gravida Para Term Preterm AB Living   0             SAB TAB Ectopic Multiple Live Births                   Home Medications    Prior to Admission medications   Medication Sig Start Date End Date Taking? Authorizing Provider  acetaminophen (TYLENOL) 500 MG tablet Take 1,000 mg by mouth every 6 (six) hours as needed for mild pain, moderate pain or headache.   Yes [provider]    amitriptyline (ELAVIL) 25 MG tablet Take 25 mg by mouth at bedtime.   Yes [provider]  dexamethasone 0.5 MG/5ML elixir Rinse with 5 mL for 2 minutes and then spit out. Perform 4 times daily until lesions resolve. 12/09/16  Yes [provider]  docusate sodium (COLACE) 100 MG capsule Take 100 mg by mouth daily.   Yes [provider]  DULoxetine (CYMBALTA) 60 MG capsule Take 60 mg by mouth at bedtime.   Yes [provider]  ferrous sulfate 325 (65 FE) MG tablet Take 325 mg by mouth 3 (three) times daily.   Yes [provider]  hydrocortisone cream 0.5 % Apply 1 application topically 2 (two) times daily as needed for itching.   Yes [provider]  loratadine (CLARITIN) 10 MG tablet Take 10 mg by mouth at bedtime.   Yes [provider]  magnesium 30 MG tablet Take 30 mg by mouth every evening.   Yes [provider]  ondansetron (ZOFRAN-ODT) 4 MG disintegrating tablet Take 4 mg by mouth every 8 (eight) hours as needed for nausea or vomiting.   Yes [provider]  oxyCODONE-acetaminophen (PERCOCET/ROXICET) 5-325 MG tablet Take 2 tablets by mouth every 6 (six) hours as needed for severe pain. 08/20/16  Yes Dhungel, Nishant, MD  vitamin C (ASCORBIC ACID) 500 MG  tablet Take 500 mg by mouth 3 (three) times daily.   Yes [provider]  Vitamin D, Ergocalciferol, (DRISDOL) 50000 units CAPS capsule Take 50,000 Units by mouth once a week. 06/30/16  Yes [provider]  HYDROcodone-acetaminophen (NORCO/VICODIN) 5-325 MG tablet Take 1 tablet by mouth every 6 (six) hours as needed. 09/28/17   Derwood Kaplan, MD  lidocaine (LMX) 4 % cream Apply 1 application topically daily as needed (skin). 09/28/17   Derwood Kaplan, MD  naproxen (NAPROSYN) 500 MG tablet Take 1 tablet (500 mg total) by mouth 2 (two) times daily. 09/28/17   Derwood Kaplan, MD  triamcinolone (KENALOG) 0.025 % ointment Apply 1 application topically 2  (two) times daily. 09/28/17   Derwood Kaplan, MD    Family History Family History  Problem Relation Age of Onset  . Graves' disease Mother   . Turner syndrome Sister   . Diabetes Paternal Aunt   . Aneurysm Paternal Uncle   . Diabetes Paternal Grandmother     Social History Social History  Substance Use Topics  . Smoking status: Never Smoker  . Smokeless tobacco: Never Used  . Alcohol use No     Allergies   Strawberry flavor   Review of Systems Review of Systems  Constitutional: Positive for activity change.  Genitourinary: Positive for vaginal bleeding and vaginal pain.  Allergic/Immunologic: Negative for immunocompromised state.  Hematological: Does not bruise/bleed easily.     Physical Exam Updated Vital Signs BP 114/69 (BP Location: Left Arm)   Pulse 83   Temp 98.8 F (37.1 C) (Oral)   Resp 14   SpO2 100%   Physical Exam  Constitutional: She is oriented to person, place, and time. She appears well-developed.  HENT:  Head: Atraumatic.  Cardiovascular: Normal rate.   Pulmonary/Chest: Effort normal. No respiratory distress.  Abdominal: Soft. There is tenderness.  Genitourinary: Vagina normal and uterus normal.  Genitourinary Comments: External exam - normal, no lesions Speculum exam: Pt has no discharge, + blood, but there is no ulcer that I can appreciate.   Neurological: She is alert and oriented to person, place, and time.  Skin: Skin is warm and dry.  Nursing note and vitals reviewed.    ED Treatments / Results  Labs (all labs ordered are listed, but only abnormal results are displayed) Labs Reviewed  WET PREP, GENITAL - Abnormal; Notable for the following:       Result Value   WBC, Wet Prep HPF POC FEW (*)    All other components within normal limits  URINALYSIS, ROUTINE W REFLEX MICROSCOPIC - Abnormal; Notable for the following:    Hgb urine dipstick LARGE (*)    All other components within normal limits  GC/CHLAMYDIA PROBE AMP ([facility]) NOT AT [facility]    EKG  EKG Interpretation None       Radiology No results found.  Procedures Procedures (including critical care time)  Medications Ordered in ED Medications  HYDROcodone-acetaminophen (NORCO/VICODIN) 5-325 MG per tablet 1 tablet (not administered)     Initial Impression / Assessment and Plan / ED Course  I have reviewed the triage vital signs and the nursing notes.  Pertinent labs & imaging results that were available during my care of the patient were reviewed by me and considered in my medical decision making (see chart for details).    Patient comes in with the vaginal pain and bleeding. Patient has history of Behcet's syndrome, it appears that she is having a flareup. Patient is not on  any kind of medication besides Naprosyn at this time. Patient had similar symptoms with her flareup in the past. Patient has no risk factors for STDs, or hx of it, and she seems reliable to me. We will start patient on Kenalog topical, lidocaine topical and anti-inflammatory medications. Norco given for severe pain only.  Strict ER return precautions have been discussed, and patient is agreeing with the plan and is comfortable with the workup done and the recommendations from the ER.  Pt ro see her doctor in 2-5 days.  Final Clinical Impressions(s) / ED Diagnoses   Final diagnoses:  Behcet's vulvo-vaginal ulcer (HCC)    New Prescriptions New Prescriptions   HYDROCODONE-ACETAMINOPHEN (NORCO/VICODIN) 5-325 MG TABLET    Take 1 tablet by mouth every 6 (six) hours as needed.   TRIAMCINOLONE (KENALOG) 0.025 % OINTMENT    Apply 1 application topically 2 (two) times daily.     Derwood Kaplan, MD 09/28/17 [phone]

## 2017-09-28 NOTE — Discharge Instructions (Signed)
Please start the meds ordered for pain control. See your doctor in 3-5 days.

## 2017-09-29 LAB — GC/CHLAMYDIA PROBE AMP (~~LOC~~) NOT AT ARMC
Chlamydia: NEGATIVE
NEISSERIA GONORRHEA: NEGATIVE

## 2017-10-03 ENCOUNTER — Encounter: Payer: Self-pay | Admitting: Family

## 2017-10-03 ENCOUNTER — Ambulatory Visit (INDEPENDENT_AMBULATORY_CARE_PROVIDER_SITE_OTHER): Admitting: Family

## 2017-10-03 VITALS — BP 112/82 | Ht <= 58 in | Wt 107.0 lb

## 2017-10-03 DIAGNOSIS — R102 Pelvic and perineal pain: Secondary | ICD-10-CM | POA: Diagnosis not present

## 2017-10-03 DIAGNOSIS — Z130 Encounter for screening for diseases of the blood and blood-forming organs and certain disorders involving the immune mechanism: Secondary | ICD-10-CM

## 2017-10-03 DIAGNOSIS — Z975 Presence of (intrauterine) contraceptive device: Secondary | ICD-10-CM | POA: Diagnosis not present

## 2017-10-03 DIAGNOSIS — Z7689 Persons encountering health services in other specified circumstances: Secondary | ICD-10-CM

## 2017-10-03 DIAGNOSIS — N921 Excessive and frequent menstruation with irregular cycle: Secondary | ICD-10-CM

## 2017-10-03 LAB — POCT HEMOGLOBIN: Hemoglobin: 11.9 g/dL — AB (ref 12.2–16.2)

## 2017-10-03 MED ORDER — POLYETHYLENE GLYCOL 3350 17 GM/SCOOP PO POWD: 17.0000 g | Freq: Every day | ORAL | 11 refills | Status: DC

## 2017-10-03 NOTE — Progress Notes (Signed)
THIS RECORD MAY CONTAIN CONFIDENTIAL INFORMATION THAT SHOULD NOT BE RELEASED WITHOUT REVIEW OF THE SERVICE PROVIDER.  Adolescent Medicine Consultation Follow-Up Visit Roshni Mom  is a 21 y.o. female referred by No ref. provider found here today for follow-up regarding BTB with IUD.  Last seen in [facility]cent [facility] Clinic on 09/19/17 for same.  Plan at last visit included improving symptoms.   Pertinent Labs? No Growth Chart Viewed? no   History was provided by the patient.  Interpreter? no  PCP Confirmed?  no  My Chart Activated?   yes    Chief Complaint  Patient presents with  . Follow-up    HPI:  Patient reports that she is having sharp vaginal pains that started early last week. Went to ED on 10/3 for vaginal pain and bleeding. Diagnosed with a Behcet's flareup and was started patient on Kenalog topical, lidocaine topical and anti-inflammatory medications. Norco given for severe pain only. Patient reports that the vaginal pain has somewhat improved with the kenalog and lidocaine cream.   She is still bleeding everyday. Has to change her pad every other hour. Patient reports that bleeding has been heavier over the past week. Before, she would change her pads every 3 hours. Patient held off on taking lysteda because she wanted to see if periods got lighter. She reports that over the past few days bleeding has improved, instead of pad being completely soaked, about 50% is soaked.    Continues taking her iron supplement TID.   Headaches - Still occur daily. More towards the end of the day. Taking tylenol, which helps.    Still at [employer], studying medical anthropology. Grades are well. Gets about 5 hrs of sleep. On amytriptiline and cymbalta, which showed help with sleep. Was taking melatonin last year, with no improvements.   Reports that her pain in back, legs, chest and lower extremities have been worsening. Having issues walking long distances. Has been using her wheelchair  since Last Friday when she has to walk long distances. Scheduling appt with pain management of thanksgiving break.     Review of Systems  Constitutional: Negative for malaise/fatigue.  Eyes: Negative for double vision.  Respiratory: Negative for shortness of breath.   Cardiovascular: Negative for chest pain and palpitations.  Gastrointestinal: Negative for abdominal pain, constipation, diarrhea, nausea and vomiting.  Genitourinary: Negative for dysuria.       Vaginal pain   Musculoskeletal: Negative for joint pain and myalgias.  Skin: Negative for rash.  Neurological: Negative for dizziness.  Endo/Heme/Allergies: Does not bruise/bleed easily.    Patient's last menstrual period was 10/03/2017 (approximate). Allergies  Allergen Reactions  . Strawberry Flavor Anaphylaxis   Outpatient Medications Prior to Visit  Medication Sig Dispense Refill  . acetaminophen (TYLENOL) 500 MG tablet Take 1,000 mg by mouth every 6 (six) hours as needed for mild pain, moderate pain or headache.    Marland Kitchen amitriptyline (ELAVIL) 25 MG tablet Take 25 mg by mouth at bedtime.    Marland Kitchen dexamethasone 0.5 MG/5ML elixir Rinse with 5 mL for 2 minutes and then spit out. Perform 4 times daily until lesions resolve.    . docusate sodium (COLACE) 100 MG capsule Take 100 mg by mouth daily.    . DULoxetine (CYMBALTA) 60 MG capsule Take 60 mg by mouth at bedtime.    . ferrous sulfate 325 (65 FE) MG tablet Take 325 mg by mouth 3 (three) times daily.    Marland Kitchen HYDROcodone-acetaminophen (NORCO/VICODIN) 5-325 MG tablet Take 1 tablet by mouth  every 6 (six) hours as needed. 5 tablet 0  . hydrocortisone cream 0.5 % Apply 1 application topically 2 (two) times daily as needed for itching.    . lidocaine (LMX) 4 % cream Apply 1 application topically daily as needed (skin). 30 g 0  . loratadine (CLARITIN) 10 MG tablet Take 10 mg by mouth at bedtime.    . magnesium 30 MG tablet Take 30 mg by mouth every evening.    . naproxen (NAPROSYN) 500 MG  tablet Take 1 tablet (500 mg total) by mouth 2 (two) times daily. 10 tablet 0  . ondansetron (ZOFRAN-ODT) 4 MG disintegrating tablet Take 4 mg by mouth every 8 (eight) hours as needed for nausea or vomiting.    . triamcinolone (KENALOG) 0.025 % ointment Apply 1 application topically 2 (two) times daily. 15 g 0  . vitamin C (ASCORBIC ACID) 500 MG tablet Take 500 mg by mouth 3 (three) times daily.    . Vitamin D, Ergocalciferol, (DRISDOL) 50000 units CAPS capsule Take 50,000 Units by mouth once a week.    Marland Kitchen oxyCODONE-acetaminophen (PERCOCET/ROXICET) 5-325 MG tablet Take 2 tablets by mouth every 6 (six) hours as needed for severe pain. (Patient not taking: Reported on 10/03/2017) 20 tablet 0   No facility-administered medications prior to visit.      Patient Active Problem List   Diagnosis Date Noted  . Irritable bowel syndrome with both constipation and diarrhea 08/15/2017  . Rash of face 08/15/2017  . Anemia due to chronic blood loss 05/02/2017  . Chronic pain syndrome 08/20/2016  . Iron deficiency anemia 08/20/2016  . Menorrhagia 08/20/2016  . Polyarthralgia   . Sinus tachycardia 08/16/2016  . 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 Back pain 08/16/2016  . Chest pain 08/16/2016  . SIRS (systemic inflammatory response syndrome) (HCC) 08/16/2016     Physical Exam:  Vitals:   10/03/17 1349  BP: 112/82  Weight: 107 lb (48.5 kg)  Height:  (1.473 m)   BP 112/82 (BP Location: Left Arm, Patient Position: Sitting, Cuff Size: Normal)   Ht  (1.473 m)   Wt 107 lb (48.5 kg)   LMP 10/03/2017 (Approximate) Comment: intermittent bleeding  BMI 22.36 kg/m  Body mass index: body mass index is 22.36 kg/m. Blood pressure percentiles are 68 % systolic and 97 % diastolic based on the August 2017 AAP Clinical Practice Guideline. Blood pressure percentile targets: 90: 123/76, 95: 128/80, 95 + 12 mmHg: 140/92. This reading is in the Stage 1 hypertension range (BP >= 130/80).  Lab Results   Component Value Date   HGB 11.9 (A) 10/03/2017   Wt Readings from Last 3 Encounters:  10/03/17 107 lb (48.5 kg) (11 %, Z= -1.22)*  09/19/17 109 lb 12.8 oz (49.8 kg) (16 %, Z= -1.02)*  08/15/17 107 lb (48.5 kg) (11 %, Z= -1.21)*   * Growth percentiles are based on CDC 2-20 Years data.   Physical Exam  Constitutional: She is oriented to person, place, and time. She appears well-developed. No distress.  HENT:  Head: Normocephalic and atraumatic.  Eyes: Pupils are equal, round, and reactive to light. EOM are normal. No scleral icterus.  Neck: Normal range of motion. Neck supple. No thyromegaly present.  Cardiovascular: Normal rate, regular rhythm, normal heart sounds and intact distal pulses.   No murmur heard. Pulmonary/Chest: Effort normal and breath sounds normal.  Abdominal: Soft.  Genitourinary:  Genitourinary Comments: Speculum exam: minimal amount of bleeding, no lesions noted, IUD strings present outside of  cervix. Chaperoned by Rush Landmark , NP.   Musculoskeletal: Normal range of motion. She exhibits no edema.  Lymphadenopathy:    She has no cervical adenopathy.  Neurological: She is alert and oriented to person, place, and time. No cranial nerve deficit.  Skin: Skin is warm and dry. Rash (dry scaly skin on cheek) noted.  Psychiatric: She has a normal mood and affect. Her behavior is normal. Judgment and thought content normal.   Assessment/Plan: Carlia is a 21 yo F with PMH of Bechet's syndrome and Chronic pain syndrome who presents for follow up of BTB on IUD and vaginal pain. Given that bleeding as slowed down and Hgb is 11.9 (improved from 9.9 two weeks ago) will hold on starting Lysteda. In regards to vaginal pain, there were no evidence of lesions or abnormal placement of IUD on speculum exam. Encouraged patient to make appointment with pain team at [facility]. Will follow up in 6 weeks.    1. Breakthrough bleeding with IUD - Consider starting Lysteda if bleeding starts to  worsen. Bleeding is stable today.  2. Vaginal pain - Patient will make appointment with pain team at [facility]  3. Screening for deficiency anemia - POCT hemoglobin - Hgb is improving. Encouraged patient to continue iron supplements TID.   Lab Results  Component Value Date   HGB 11.9 (A) 10/03/2017    4. Sleep concern -  Encouraged patient to discuss sleep with pain team with [facility]. There are some medications that chronic pain that can help with sleep. She is currently on amytriptyline 25 mg daily, can possibly increase this dose to help with pain and sleep.    Follow-up:  Return in about 6 weeks (around 11/14/2017) for follow up .  Medical decision-making:  >25 minutes spent face to face with patient with more than 50% of appointment spent discussing diagnosis, management, follow-up, and reviewing bleeding and pain symptoms.

## 2017-10-03 NOTE — Patient Instructions (Signed)
-   Please schedule appointment with Pain team at [facility]. Make sure to discuss pain & sleep.

## 2017-11-14 ENCOUNTER — Ambulatory Visit: Payer: Self-pay | Admitting: Family

## 2017-11-28 ENCOUNTER — Ambulatory Visit (INDEPENDENT_AMBULATORY_CARE_PROVIDER_SITE_OTHER): Admitting: Family

## 2017-11-28 ENCOUNTER — Other Ambulatory Visit: Payer: Self-pay

## 2017-11-28 VITALS — BP 122/75 | HR 98 | Ht 58.37 in | Wt 108.4 lb

## 2017-11-28 DIAGNOSIS — N921 Excessive and frequent menstruation with irregular cycle: Secondary | ICD-10-CM

## 2017-11-28 DIAGNOSIS — K582 Mixed irritable bowel syndrome: Secondary | ICD-10-CM | POA: Diagnosis not present

## 2017-11-28 DIAGNOSIS — G894 Chronic pain syndrome: Secondary | ICD-10-CM | POA: Diagnosis not present

## 2017-11-28 NOTE — Progress Notes (Signed)
THIS RECORD MAY CONTAIN CONFIDENTIAL INFORMATION THAT SHOULD NOT BE RELEASED WITHOUT REVIEW OF THE SERVICE PROVIDER.  Adolescent Medicine Consultation Follow-Up Visit Susan George  is a 21 y.o. female referred by No ref. provider found here today for follow-up regarding IUD in place, menorrhagia.    Last seen in Adolescent Medicine Clinic on 10/03/17 for same.  Plan at last visit included.  Pertinent Labs? No Growth Chart Viewed? no   History was provided by the patient.  Interpreter? no  PCP Confirmed?  no  My Chart Activated?   yes    Chief Complaint  Patient presents with  . Follow-up  . Medication Management    HPI:   [place] and would like letter for therapy dog.  [facility] Per Madaline GuthrieSteiner, needs cbc in January here if possible for patient convenience.   Letter for therapy dog:  Would be beneficial for her tx for help with anxiety and pain management.   IUD:  Not heavy, just spotting lightly daily.   Vaginal pain:  Sharp, some throbbing; intermittent but daily.   Denies dysuria, lesions..   Review of Systems  Constitutional: Negative for malaise/fatigue.  Eyes: Negative for double vision.  Respiratory: Negative for shortness of breath.   Cardiovascular: Negative for chest pain and palpitations.  Gastrointestinal: Positive for blood in stool (hemorrhoid ). Negative for abdominal pain, constipation, diarrhea, nausea and vomiting.  Genitourinary: Negative for dysuria.  Musculoskeletal: Negative for joint pain and myalgias.  Skin: Negative for rash.  Neurological: Negative for dizziness and headaches.  Endo/Heme/Allergies: Does not bruise/bleed easily.  Psychiatric/Behavioral: Negative for suicidal ideas. The patient is nervous/anxious.     No LMP recorded. Patient is not currently having periods (Reason: Irregular Periods). Allergies  Allergen Reactions  . Strawberry Flavor Anaphylaxis   Outpatient  Medications Prior to Visit  Medication Sig Dispense Refill  . acetaminophen (TYLENOL) 500 MG tablet Take 1,000 mg by mouth every 6 (six) hours as needed for mild pain, moderate pain or headache.    Marland Kitchen. amitriptyline (ELAVIL) 25 MG tablet Take 50 mg by mouth at bedtime.     Marland Kitchen. dexamethasone 0.5 MG/5ML elixir Rinse with 5 mL for 2 minutes and then spit out. Perform 4 times daily until lesions resolve.    . docusate sodium (COLACE) 100 MG capsule Take 100 mg by mouth daily.    . DULoxetine (CYMBALTA) 60 MG capsule Take 60 mg by mouth at bedtime.    . ferrous sulfate 325 (65 FE) MG tablet Take 325 mg by mouth 3 (three) times daily.    Marland Kitchen. gabapentin (NEURONTIN) 300 MG capsule Take 300 mg by mouth 3 (three) times daily.    Marland Kitchen. HYDROcodone-acetaminophen (NORCO/VICODIN) 5-325 MG tablet Take 1 tablet by mouth every 6 (six) hours as needed. 5 tablet 0  . hydrocortisone cream 0.5 % Apply 1 application topically 2 (two) times daily as needed for itching.    . lidocaine (LMX) 4 % cream Apply 1 application topically daily as needed (skin). 30 g 0  . loratadine (CLARITIN) 10 MG tablet Take 10 mg by mouth at bedtime.    . magnesium 30 MG tablet Take 30 mg by mouth every evening.    . naproxen (NAPROSYN) 500 MG tablet Take 1 tablet (500 mg total) by mouth 2 (two) times daily. 10 tablet 0  . ondansetron (ZOFRAN-ODT) 4 MG disintegrating tablet Take 4 mg by mouth every 8 (eight) hours as needed for nausea or vomiting.    .Marland Kitchen  oxyCODONE-acetaminophen (PERCOCET/ROXICET) 5-325 MG tablet Take 2 tablets by mouth every 6 (six) hours as needed for severe pain. 20 tablet 0  . polyethylene glycol powder (GLYCOLAX/MIRALAX) powder Take 17 g by mouth daily. (1 capful) 500 g 11  . Vitamin D, Ergocalciferol, (DRISDOL) 50000 units CAPS capsule Take 50,000 Units by mouth once a week.    . triamcinolone (KENALOG) 0.025 % ointment Apply 1 application topically 2 (two) times daily. (Patient not taking: Reported on 11/28/2017) 15 g 0  . vitamin  C (ASCORBIC ACID) 500 MG tablet Take 500 mg by mouth 3 (three) times daily.     No facility-administered medications prior to visit.      Patient Active Problem List   Diagnosis Date Noted  . Irritable bowel syndrome with both constipation and diarrhea 08/15/2017  . Rash of face 08/15/2017  . Anemia due to chronic blood loss 05/02/2017  . Chronic pain syndrome 08/20/2016  . Iron deficiency anemia 08/20/2016  . Menorrhagia 08/20/2016  . Polyarthralgia   . Sinus tachycardia 08/16/2016  . 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 Back pain 08/16/2016  . Chest pain 08/16/2016  . SIRS (systemic inflammatory response syndrome) (HCC) 08/16/2016    Confidentiality was discussed with the patient and if applicable, with caregiver as well.  The following portions of the patient's history were reviewed and updated as appropriate: allergies, current medications, past medical history and problem list.  Physical Exam:  Vitals:   11/28/17 1541  BP: 122/75  Pulse: 98  Weight: 108 lb 6.4 oz (49.2 kg)  Height: 4' 10.37" (1.483 m)   BP 122/75 (BP Location: Left Arm, Patient Position: Sitting, Cuff Size: Normal)   Pulse 98   Ht 4' 10.37" (1.483 m)   Wt 108 lb 6.4 oz (49.2 kg)   BMI 22.37 kg/m  Body mass index: body mass index is 22.37 kg/m. Blood pressure percentiles are 87 % systolic and 86 % diastolic based on the August 2017 AAP Clinical Practice Guideline. Blood pressure percentile targets: 90: 123/76, 95: 128/80, 95 + 12 mmHg: 140/92. This reading is in the elevated blood pressure range (BP >= 120/80).  Wt Readings from Last 3 Encounters:  11/28/17 108 lb 6.4 oz (49.2 kg) (13 %, Z= -1.13)*  10/03/17 107 lb (48.5 kg) (11 %, Z= -1.22)*  09/19/17 109 lb 12.8 oz (49.8 kg) (15 %, Z= -1.02)*   * Growth percentiles are based on CDC (Girls, 2-20 Years) data.    Physical Exam  Constitutional: She appears well-developed. No distress.  HENT:  Head: Normocephalic and atraumatic.  Eyes: EOM are  normal. Pupils are equal, round, and reactive to light. No scleral icterus.  Neck: Normal range of motion. No thyromegaly present.  Cardiovascular: Normal rate and regular rhythm.  No murmur heard. Pulmonary/Chest: Effort normal and breath sounds normal.  Abdominal: Soft.  Musculoskeletal: Normal range of motion. She exhibits no edema.  Lymphadenopathy:    She has no cervical adenopathy.  Neurological: She is alert.  Skin: Skin is warm and dry. No rash noted.  Psychiatric: Her mood appears anxious.   Assessment/Plan: 1. Menorrhagia with irregular cycle -well-controlled with IUD  -BTB consistently improving each cycle  2. Chronic pain syndrome -ESA letter given   3. Irritable bowel syndrome with both constipation and diarrhea -discussed hemorrhoid care and return precautions   Follow-up:  Scheduled for follow-up and labs in January as per HPI   Medical decision-making:  >15 minutes spent face to face with patient with more than 50% of  appointment spent discussing diagnosis, management, follow-up, and reviewing IUD return precautions, hemorrhoid care, and chronic pain syndrome (ESA letter) as above.

## 2017-12-29 ENCOUNTER — Encounter: Payer: Self-pay | Admitting: Pediatrics

## 2018-01-09 ENCOUNTER — Ambulatory Visit (INDEPENDENT_AMBULATORY_CARE_PROVIDER_SITE_OTHER): Admitting: Pediatrics

## 2018-01-09 ENCOUNTER — Ambulatory Visit (INDEPENDENT_AMBULATORY_CARE_PROVIDER_SITE_OTHER): Admitting: Clinical

## 2018-01-09 VITALS — BP 122/76 | HR 111 | Ht <= 58 in | Wt 104.8 lb

## 2018-01-09 DIAGNOSIS — L210 Seborrhea capitis: Secondary | ICD-10-CM

## 2018-01-09 DIAGNOSIS — D500 Iron deficiency anemia secondary to blood loss (chronic): Secondary | ICD-10-CM

## 2018-01-09 DIAGNOSIS — G894 Chronic pain syndrome: Secondary | ICD-10-CM

## 2018-01-09 DIAGNOSIS — B360 Pityriasis versicolor: Secondary | ICD-10-CM | POA: Diagnosis not present

## 2018-01-09 DIAGNOSIS — F064 Anxiety disorder due to known physiological condition: Secondary | ICD-10-CM

## 2018-01-09 LAB — CBC
HCT: 37 % (ref 35.0–45.0)
HEMOGLOBIN: 11.6 g/dL — AB (ref 11.7–15.5)
MCH: 26.2 pg — AB (ref 27.0–33.0)
MCHC: 31.4 g/dL — AB (ref 32.0–36.0)
MCV: 83.7 fL (ref 80.0–100.0)
MPV: 10.5 fL (ref 7.5–12.5)
PLATELETS: 332 10*3/uL (ref 140–400)
RBC: 4.42 10*6/uL (ref 3.80–5.10)
RDW: 13.6 % (ref 11.0–15.0)
WBC: 3.8 10*3/uL (ref 3.8–10.8)

## 2018-01-09 LAB — FERRITIN: FERRITIN: 33 ng/mL (ref 6–67)

## 2018-01-09 MED ORDER — KETOCONAZOLE 2 % EX CREA: TOPICAL_CREAM | CUTANEOUS | 0 refills | Status: DC

## 2018-01-09 MED ORDER — KETOCONAZOLE 2 % EX SHAM: 1.0000 "application " | MEDICATED_SHAMPOO | CUTANEOUS | 0 refills | Status: DC

## 2018-01-09 NOTE — BH Specialist Note (Signed)
Integrated Behavioral Health Initial Visit  MRN: [id number] Name: Susan George  Number of Integrated Behavioral Health Clinician visits:: 1/6 Session Start time: 2:27P  Session End time: 2:47P Total time: 20 minutes  Type of Service: Integrated Behavioral Health- Individual/Family Interpretor:No. Interpretor Name and Language: N/A    Warm Hand Off Completed.       SUBJECTIVE: Susan George is a 22 y.o. female  Patient was referred by Dr.Perry for consistent somatic symptoms. Patient reports the following symptoms/concerns:abdominal pain- potentially related to IUD or menstruation and anxiety.  Duration of problem: few weeks; Severity of problem: moderate  OBJECTIVE: Mood: Euthymic and Affect: Appropriate Risk of harm to self or others: Not assessed.   LIFE CONTEXT: Family and Social: Not assessed.  School/Work: Pt just began classes for the spring semester. She is anxious about managing school along with her medical appointments.  Self-Care: Heat pad and medication to manage pain symptoms.   Life Changes: Not assessed.   GOALS ADDRESSED: Patient will: 1. Reduce symptoms of: pain 2. Increase knowledge and/or ability of: coping skills  3. Demonstrate ability to: Increase healthy adjustment to current life circumstances  INTERVENTIONS: Interventions utilized: Mindfulness or Relaxation Training  Standardized Assessments completed: PHQ-SADS   PHQ-15 Score: 21 GADS-7 Score: 11 PHQ-9 Score: 12   ASSESSMENT: Patient currently experiencing pain and anxiety symptoms.    Patient may benefit from learning and practicing skills to help with pain management and reduction of anxiety symptoms. Pt was shown mindfulness techniques (categories, 5 senses) and deep breathing. She found these strategies to be useful.   PLAN: 1. Follow up with behavioral health clinician on : 02/06/18 @ 2:30PM 2. Behavioral recommendations: Pt plans to practice mindfulness strategies (categories, 5  senses) and deep breathing before bed.  3. Ref[facility](s[facility] 4. "From scale of 1-10, how likely are you to follow plan?": Not assessed.   Luvenia StarchSudheera Ranaweera

## 2018-01-09 NOTE — Progress Notes (Signed)
THIS RECORD MAY CONTAIN CONFIDENTIAL INFORMATION THAT SHOULD NOT BE RELEASED WITHOUT REVIEW OF THE SERVICE PROVIDER.  Adolescent Medicine Consultation Follow-Up Visit Susan George  is a 22 y.o. female here today for follow-up regarding IUD, menorrhagia.    Last seen in Adolescent Medicine Clinic on 11/28/2017 for the same. Plan at last visit included: IUD in place, discussion of hemorrhoid care, ESA letter given to have support dog for chronic pain.  Pertinent Labs? No Growth Chart Viewed? yes   History was provided by the patient. Interpreter? no PCP Confirmed?  yes   CC: Follow up for menorrhagia and IUD  HPI:    Susan George  is a 22 y.o. female here today for follow-up regarding IUD, menorrhagia.    Has been having really bad cramps with IUD per patient.  Has been cramping a lot throughout the month. Happening every other day.  6-7/10 pain. Can be quick but other times can last for hours. Can feel pain not only suprapubic but also pain in back and anus. Pain is the same as it was immediately after IUD was placed. Will take naproxen and has heating pad, but doesn't seem to help that much. Sometimes prevents her from doing things that she wants to do, but is able to do everything that she needs to do.   Still spotting every day. Will get a period once a month. 2-3 heavy days during each period, using 1 pad an hour.  LMP was December 28- January 7.   Also has hyperpigmented lesion between breasts.  Has been there for 2-3 months.  Saw PCP but PCP unsure what it was.  Tried "scrubbing it off" but it only got a little worse.  No itching.  No prior inflammation or injury.  Also, has dandruff. Lots of flaking.  Feels that it is getting worse.  Has tried OTC shampoos but has not improved.   No LMP recorded. Patient is not currently having periods (Reason: Irregular Periods). Allergies  Allergen Reactions  . Strawberry Flavor Anaphylaxis   Outpatient Medications Prior to Visit   Medication Sig Dispense Refill  . acetaminophen (TYLENOL) 500 MG tablet Take 1,000 mg by mouth every 6 (six) hours as needed for mild pain, moderate pain or headache.    Marland Kitchen. amitriptyline (ELAVIL) 25 MG tablet Take 50 mg by mouth at bedtime.     Marland Kitchen. dexamethasone 0.5 MG/5ML elixir Rinse with 5 mL for 2 minutes and then spit out. Perform 4 times daily until lesions resolve.    . docusate sodium (COLACE) 100 MG capsule Take 100 mg by mouth daily.    . DULoxetine (CYMBALTA) 60 MG capsule Take 60 mg by mouth at bedtime.    . ferrous sulfate 325 (65 FE) MG tablet Take 325 mg by mouth 3 (three) times daily.    Marland Kitchen. gabapentin (NEURONTIN) 300 MG capsule Take 300 mg by mouth 3 (three) times daily.    . hydrocortisone cream 0.5 % Apply 1 application topically 2 (two) times daily as needed for itching.    . lidocaine (LMX) 4 % cream Apply 1 application topically daily as needed (skin). 30 g 0  . loratadine (CLARITIN) 10 MG tablet Take 10 mg by mouth at bedtime.    . magnesium 30 MG tablet Take 30 mg by mouth every evening.    . naproxen (NAPROSYN) 500 MG tablet Take 1 tablet (500 mg total) by mouth 2 (two) times daily. 10 tablet 0  . ondansetron (ZOFRAN-ODT) 4 MG disintegrating tablet Take  4 mg by mouth every 8 (eight) hours as needed for nausea or vomiting.    Marland Kitchen oxyCODONE-acetaminophen (PERCOCET/ROXICET) 5-325 MG tablet Take 2 tablets by mouth every 6 (six) hours as needed for severe pain. 20 tablet 0  . polyethylene glycol powder (GLYCOLAX/MIRALAX) powder Take 17 g by mouth daily. (1 capful) 500 g 11  . Vitamin D, Ergocalciferol, (DRISDOL) 50000 units CAPS capsule Take 50,000 Units by mouth once a week.    Marland Kitchen HYDROcodone-acetaminophen (NORCO/VICODIN) 5-325 MG tablet Take 1 tablet by mouth every 6 (six) hours as needed. (Patient not taking: Reported on 01/09/2018) 5 tablet 0  . triamcinolone (KENALOG) 0.025 % ointment Apply 1 application topically 2 (two) times daily. (Patient not taking: Reported on 11/28/2017) 15  g 0   No facility-administered medications prior to visit.      Patient Active Problem List   Diagnosis Date Noted  . Irritable bowel syndrome with both constipation and diarrhea 08/15/2017  . Rash of face 08/15/2017  . Anemia due to chronic blood loss 05/02/2017  . Chronic pain syndrome 08/20/2016  . Iron deficiency anemia 08/20/2016  . Menorrhagia 08/20/2016  . Polyarthralgia   . Sinus tachycardia 08/16/2016  . 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 Back pain 08/16/2016  . Chest pain 08/16/2016  . SIRS (systemic inflammatory response syndrome) (HCC) 08/16/2016    Social History: Changes with school since last visit?  no  Lifestyle habits that can impact QOL: School: [employer]; Sophomore; anthropology Sleep: 4 hours of sleep; has difficulty falling asleep, lots of waking up and has a hard time falling back asleep; has taken melatonin but hasn't helped Appetite: comes and goes, tries to get 2 meals a day Water: no much, maybe 2-3 bottles Exercise: takes walks, every other day Screen time: 1.5 hours (outside of homework time)  Confidentiality was discussed with the patient and if applicable, with caregiver as well.  Changes at home or school since last visit:  no  Gender identity: Female Sex assigned at birth: Female Pronouns: she Tobacco?  no Drugs/ETOH?  no Partner preference?  female  Sexually Active?  no  Pregnancy Prevention:  intrauterine device  Reviewed condoms:  yes  Suicidal or homicidal thoughts?   no Self injurious behaviors?  no   The following portions of the patient's history were reviewed and updated as appropriate: allergies, current medications, past medical history and problem list.  Physical Exam:  Vitals:   01/09/18 1422  BP: 122/76  Pulse: (!) 111  Weight: 104 lb 12.8 oz (47.5 kg)  Height: 4' 9.87" (1.47 m)   BP 122/76   Pulse (!) 111   Ht 4' 9.87" (1.47 m)   Wt 104 lb 12.8 oz (47.5 kg)   BMI 22.00 kg/m  Body mass index: body mass index  is 22 kg/m. Blood pressure percentiles are 87 % systolic and 87 % diastolic based on the August 2017 AAP Clinical Practice Guideline. Blood pressure percentile targets: 90: 123/76, 95: 128/80, 95 + 12 mmHg: 140/92. This reading is in the elevated blood pressure range (BP >= 120/80).   Physical Exam  General: alert, very quiet but interactive 22 year old female. No acute distress HEENT: normocephalic, atraumatic. Some while flakes and dry skin on scalp. PERRL. Conjunctiva clear. Nares clear. Moist mucus membranes. Oropharynx benign without lesions or exudates. Neck: supple, no LAD Cardiac: normal S1 and S2. Regular rate and rhythm. No murmurs Pulmonary: normal work of breathing. No retractions. No tachypnea. Clear bilaterally without wheezes, crackles or rhonchi.  Abdomen: soft, nontender, diffusely tender to palpation. No hepatosplenomegaly. No masses. Extremities: Warm and well-perfused. Brisk capillary refill Skin: hyperpigmented patches (3-4) between breasts; irregular borders, no erythema, no TTP Neuro: no focal deficits  Assessment/Plan:  Kylyn Mcdade  is a 22 y.o. female with a history of chronic pain and menorrhagia resulting in iron deficiency anemia who presents for follow-up post IUD placement and follow up of menorrhagia.  Menorrhagia seems improved post-IUD (placed this summer in July) but patient still having cramping. Not interfering with activities of daily living.  Will recheck labs today for iron-deficiency anemia (CBC, Ferritin).  Patient has flaking of scalp consistent with dandruff and rash on chest consistent with tinea versicolor.  Will prescribe ketoconazole shampoo and cream for each respectively.   1. Iron deficiency anemia due to chronic blood loss Labs today. Menstrual bleeding still heavy for 2-3 days while on period but improved.  - CBC: pending - Ferritin: pending  2. Chronic pain syndrome Saw [facility] today. Discussed relaxation techniques and ways to calm and  manage pain.  BH screenings: PHQ-SADS reviewed.  Scores as per [facility] note. Screens discussed with patient and adjustments to plan made accordingly.   3. Tinea versicolor Hyperpigmentation and irregularity of patches on chest consistent with tinea versicolor. Prescribed ketoconazole (NIZORAL) 2 % cream  4. Dandruff Has had dandruff for a while and OTC treatments not working.  Prescribed ketoconazole (NIZORAL) 2 % shampoo; Apply 1 application topically 2 (two) times a week.     Follow-up:  Return in about 3 months (around 04/09/2018) for follow up for IUD.    [facility] PGY-3 01/09/2018

## 2018-01-09 NOTE — BH Specialist Note (Deleted)
Integrated Behavioral Health Initial Visit  MRN: [id number] Name: Susan George  Number of Integrated Behavioral Health Clinician visits:: 1/6 Session Start time: 2:27P  Session End time: 2:47P Total time: 20 minutes  Type of Service: Integrated Behavioral Health- Individual/Family Interpretor:No. Interpretor Name and Language: N/A    Warm Hand Off Completed.       SUBJECTIVE: Susan George is a 22 y.o. female  Patient was referred by Dr.Perry for consistent somatic symptoms. Patient reports the following symptoms/concerns:abdominal pain- potentially related to IUD or menstruation. Duration of problem: few weeks; Severity of problem: moderate  OBJECTIVE: Mood: Euthymic and Affect: Appropriate Risk of harm to self or others: Not assessed.   LIFE CONTEXT: Family and Social: Not assessed.  School/Work: Pt just began classes for the spring semester. She is anxious about managing school along with her medical appointments.  Self-Care: Heat pad and medication to manage pain symptoms.   Life Changes: Not assessed.   GOALS ADDRESSED: Patient will: 1. Reduce symptoms of: pain 2. Increase knowledge and/or ability of: coping skills  3. Demonstrate ability to: Increase healthy adjustment to current life circumstances  INTERVENTIONS: Interventions utilized: Mindfulness or Relaxation Training  Standardized Assessments completed: PHQ-SADS   PHQ-15 Score: 21 GADS-7 Score: 11 PHQ-9 Score: 12   ASSESSMENT: Patient currently experiencing pain and anxiety symptoms.    Patient may benefit from learning and practicing skills to help with pain management and reduction of anxiety symptoms. Pt was shown mindfulness techniques (categories, 5 senses) and deep breathing. She found these strategies to be useful.   PLAN: 1. Follow up with behavioral health clinician on : 02/06/18 @ 2:30PM 2. Behavioral recommendations: Pt plans to practice mindfulness strategies (categories, 5 senses) and deep  breathing before bed.  3. Referral(s): [facility]) 4. "From scale of 1-10, how likely are you to follow plan?": Not assessed.   Luvenia StarchSudheera Ranaweera

## 2018-01-11 ENCOUNTER — Encounter: Payer: Self-pay | Admitting: Pediatrics

## 2018-02-06 ENCOUNTER — Ambulatory Visit (INDEPENDENT_AMBULATORY_CARE_PROVIDER_SITE_OTHER): Admitting: Clinical

## 2018-02-06 DIAGNOSIS — F064 Anxiety disorder due to known physiological condition: Secondary | ICD-10-CM | POA: Diagnosis not present

## 2018-02-06 NOTE — BH Specialist Note (Deleted)
Integrated Behavioral Health Follow Up Visit  MRN: [id number] Name: Betsy PriesKaelyn Yard  Number of Integrated Behavioral Health Clinician visits: {IBH Number of Visits:21014052} Session Start time: 2:14P  Session End time: *** Total time: {IBH Total Time:21014050}  Type of Service: Integrated Behavioral Health- Individual/Family Interpretor:{yes [id number]}no:314532} Interpretor Name and Language: ***  SUBJECTIVE: Betsy PriesKaelyn Nastasi is a 22 y.o. female accompanied by {Patient accompanied [phone]} Patient was referred by *** for ***. Patient reports the following symptoms/concerns: *** Duration of problem: ***; Severity of problem: {Mild/Moderate/Severe:20260}  OBJECTIVE: Mood: {BHH MOOD:22306} and Affect: {BHH AFFECT:22307} Risk of harm to self or others: {CHL AMB BH Suicide Current Mental Status:21022748}  LIFE CONTEXT: Family and Social: *** School/Work: *** Self-Care: *** Life Changes: ***  GOALS ADDRESSED: Patient will: 1.  Reduce symptoms of: {IBH Symptoms:21014056}  2.  Increase knowledge and/or ability of: {IBH Patient Tools:21014057}  3.  Demonstrate ability to: {IBH Goals:21014053}  INTERVENTIONS: Interventions utilized:  {IBH Interventions:21014054} Standardized Assessments completed: {IBH Screening Tools:21014051}  ASSESSMENT: Patient currently experiencing ***.   Patient may benefit from ***.  PLAN: 1. Follow up with behavioral health clinician on : *** 2. Behavioral recommendations: *** 3. Referral(s): {IBH Referrals:21014055} 4. "From scale of 1-10, how likely are you to follow plan?": ***  Luvenia StarchSudheera Ranaweera

## 2018-02-06 NOTE — BH Specialist Note (Signed)
Integrated Behavioral Health Follow Up Visit  MRN: [id number] Name: Susan George  Number of Integrated Behavioral Health Clinician visits: 2/6 Session Start time: 2:14P  Session End time: 2:50P Total time: 36 minutes  Type of Service: Integrated Behavioral Health- Individual/Family Interpretor:No. Interpretor Name and Language: N/A  SUBJECTIVE: Susan George is a 22 y.o. female. Patient was referred by Dr.Perry for consistent somatic symptoms  Patient reports the following symptoms/concerns: chronic pain, worry, difficulty sleeping, and shortness of breath  Duration of problem: months Severity of problem: severe  OBJECTIVE: Mood: Euthymic and Affect: Appropriate Risk of harm to self or others: Not assessed  LIFE CONTEXT:  Family and Social: Pt's mom reminds pt to not stress School/Work: Pt has had some difficulty receiving accomdations for classes through [employer]  Self-Care: Not assessed  Life Changes: None mentioned  GOALS ADDRESSED: Patient will reduce her symptoms of anxiety   INTERVENTIONS: Interventions utilized:  Brief CBT and Psychoeducation and/or Health Education Standardized Assessments completed: PHQ-SADS PHQ-15 Score: 23 GADS-7 Score: 12 PHQ-9 Score: 17   ASSESSMENT: Patient currently experiencing feeling of anxiety around school (waking up on time, having materials ready for class) and social interactions.   Patient may benefit from practicing relaxation skills and maintaining a thought record.   PLAN: 1. Follow up with behavioral health clinician on : 02/13/18 2. Behavioral recommendations: Practice relaxation skills and maintain thought record.  3. Referral(s): [facility]) 4. "From scale of 1-10, how likely are you to follow plan?": N/A  Luvenia StarchSudheera Ranaweera

## 2018-02-07 ENCOUNTER — Telehealth: Payer: Self-pay | Admitting: Clinical

## 2018-02-09 NOTE — Telephone Encounter (Signed)
Called to provide information about [employer] counseling center and [facility] Scientist, research (physical sciences)(Office of [employer]).  [facility]

## 2018-02-13 ENCOUNTER — Ambulatory Visit (INDEPENDENT_AMBULATORY_CARE_PROVIDER_SITE_OTHER): Admitting: Clinical

## 2018-02-13 DIAGNOSIS — G894 Chronic pain syndrome: Secondary | ICD-10-CM | POA: Diagnosis not present

## 2018-02-13 DIAGNOSIS — F064 Anxiety disorder due to known physiological condition: Secondary | ICD-10-CM | POA: Diagnosis not present

## 2018-02-13 DIAGNOSIS — F411 Generalized anxiety disorder: Secondary | ICD-10-CM | POA: Insufficient documentation

## 2018-02-13 NOTE — BH Specialist Note (Signed)
Integrated Behavioral Health Follow Up Visit  MRN: [id number] Name: Susan George  Number of Integrated Behavioral Health Clinician visits: 3/6 Session Start time: 2:13P  Session End time: 3:07P Total time: 54 minutes   Type of Service: Integrated Behavioral Health- Individual/Family Interpretor:No. Interpretor Name and Language: N/A  SUBJECTIVE: Susan George is a 22 y.o. female.  Patient was referred by Dr.Perry for consistent somatic symptoms  Patient reports the following symptoms/concerns: sadness, frustration, and worry about school Duration of problem: Weeks, Severity of problem: moderate  OBJECTIVE: Mood: Euthymic and Affect: Appropriate Risk of harm to self or others: Not assessed  LIFE CONTEXT: Family and Social: Has parents and one friend for social support. Pt reported feeling like her symptoms aren't being taken seriously by parents.  School/Work: Did poorly on Pre calculus test. Finds her Arabic class to be easy. Worried about paying for summer classes and has applied for summer funding.  Self-Care: Not assessed Life Changes: May be getting an emotional support dog.  GOALS ADDRESSED: Pt wanted to discuss her feelings of stress and frustration with regards to school and her relationships   INTERVENTIONS: Interventions utilized:  Mining engineerBehavioral Activation, Supportive Counseling, Psychoeducation and/or Health Education and Link to [employer] Resources Standardized Assessments completed: None completed  ASSESSMENT: Pt currently experiencing feeling of stress related to her classes and school performance. She also expressed feeling frustrated in her relationships. Specifically, pt reported feeling like she is not being heard by her parents when she discusses her pain.   Patient may benefit from journaling, practicing mindfulness and relaxation skills, and behavioral activation. Additionally, she may benefit from taking advantage of the resources offered through [employer] (counseling  center, psychology clinic, math tutoring, and accommodations/services)   PLAN: 1. Follow up with behavioral health clinician on :03/06/18 2. Behavioral recommendations: Journal, mindfulness and relaxation exercises, and  Watch/look at pictures of dogs (for behavioral activation)  3. Referral(s): [facility] (In Clinic) 4. "From scale of 1-10, how likely are you to follow plan?": Not assessed  Luvenia StarchSudheera Ranaweera

## 2018-02-13 NOTE — Patient Instructions (Signed)
[employer] [facility]  [email or web address]  [employer] Counseling [email or web address]  [facility]  [email or web address]  [employer] Math Tutoring [email or web address]

## 2018-02-15 NOTE — BH Specialist Note (Signed)
Integrated Behavioral Health Follow Up Visit  MRN: [id number] Name: Susan George  Number of Integrated Behavioral Health Clinician visits: 3/6 Session Start time: 2:15pm  Session End time: 3:07pm Total time: 54 min Joint visit with Janie MorningS. Ranaweera, [facility] intern  Type of Service: Integrated Behavioral Health- Individual/Family Interpretor:No. Interpretor Name and Language: n/a  SUBJECTIVE: Susan George is a 22 y.o. female accompanied by self Patient was referred by Dr. Marina GoodellPerry for consistent somatic symptoms. Patient reports the following symptoms/concerns: sadness, frustration and worry about school, recently had low grade in a subject, also feels that her symptoms aren't being taken seriously by her parents Duration of problem: Weeks; Severity of problem: moderate  OBJECTIVE: Mood: Euthymic and Affect: Appropriate Risk of harm to self or others: Not assessed today  LIFE CONTEXT: Family and Social: Has parents and one friend for social support.   School/Work: [employer] - not doing well in Pre-calculus, Arabic class good, worried about paying for summer classes & applied for funding Self-Care: Not assessed Life Changes: Adjusting to college life, may be getting an emotional support dog  GOALS ADDRESSED: Patient will: 1.  Reduce symptoms of: anxiety  2.  Demonstrate ability to: Increase healthy adjustment to current life circumstances with school & relationships  INTERVENTIONS: Interventions utilized:  Mindfulness or Management consultantelaxation Training, Mining engineerBehavioral Activation, Psychoeducation and/or Health Education and Link to [employer] Resources Standardized Assessments completed: Not Needed  ASSESSMENT: Patient currently experiencing feelings of stress related to academics and managing college life with her disabilities. Susan George was able to express her feelings in regards to her interactions with her parents.  Patient may benefit from journaling, practicing mindfulness & relaxation skills and behavioral  activation.  Additional, she may benefit from ongoing resources on campus including but not limited to: counseling center, psychology clinic, math tutoring & accommodations/services)  Susan George she is already involved with the counseling center but can only be seen every 3-4 weeks and she wants to meet with someone every week.  PLAN: 1. Follow up with behavioral health clinician on : 03/06/18 due to pt's schedule 2. Behavioral recommendations:  - Practice positive coping skills including journaling, mindfulness & relaxation strategies, watch/look at pictures of dogs when stressed during class  3. Referral(s): Community Resources:  [employer] resources 4. "From scale of 1-10, how likely are you to follow plan?": Not George  Gordy SaversJasmine P Jarrick Fjeld, LCSW

## 2018-03-06 ENCOUNTER — Ambulatory Visit (INDEPENDENT_AMBULATORY_CARE_PROVIDER_SITE_OTHER): Admitting: Clinical

## 2018-03-06 ENCOUNTER — Encounter (INDEPENDENT_AMBULATORY_CARE_PROVIDER_SITE_OTHER): Payer: Self-pay

## 2018-03-06 DIAGNOSIS — F064 Anxiety disorder due to known physiological condition: Secondary | ICD-10-CM

## 2018-03-06 NOTE — BH Specialist Note (Signed)
Integrated Behavioral Health Follow Up Visit  MRN: [id number] Name: Susan George  Number of Integrated Behavioral Health Clinician visits: 4/6 Session Start time: 2:18P Session End time: 2:46P Total time:28 minutes  Type of Service: Integrated Behavioral Health- Individual/Family Interpretor:No. Interpretor Name and Language: n/a  SUBJECTIVE: Susan George is a 22 y.o. female  Patient was referred by Dr.Perry for consistent somatic symptoms  Patient reports the following symptoms/concerns: worrying/ difficulty relaxing, trouble falling or staying asleep, anxiety attacks, GI problems, and poor appetite Duration of problem: weeks Severity of problem: moderate  OBJECTIVE: Mood: Euthymic and Affect: Appropriate Risk of harm to self or others: No plan to harm self or others  LIFE CONTEXT: Family and Social: Pt's father is less supportive of pt regarding illness. [place] [employer] [place] School/Work: Pt reports feeling worried about potentially needing to stay an extra semester instead of graduating in Fall 2019 Self-Care: Not assessed. Life Changes: Pt. Is in the process of getting a service dog. She needs three professional letters of reference and would like one from behavioral health if possible  GOALS ADDRESSED: Patient will reduce symptoms of: anxiety    INTERVENTIONS: Interventions utilized:  Brief CBT and Psychoeducation and/or Health Education Standardized Assessments completed: PHQ-SADS   PHQ -15 Score:22 GAD-7 Score:15 PHQ-9 Score:13  ASSESSMENT: Patient currently experiencing anxiety in social situations. Specifically, these worries seem centered around other people's reactions to things that the pt.does. Pt.was shown the CBT triangle (modifying thoughts,beliefs, and behavior) and was able to generate relevant examples during the session.   Patient may benefit from using the CBT triangle to better manage her  feelings of anxiety.   PLAN: 1. Follow up with behavioral health clinician on : 03/13/17 at 4PM 2. Behavioral recommendations: Pt will practice using the CBT triangle between classes  3. Referral(s): [facility] (In Clinic) 4. "From scale of 1-10, how likely are you to follow plan?": Not assessed  Luvenia StarchSudheera Ranaweera

## 2018-03-13 ENCOUNTER — Ambulatory Visit (INDEPENDENT_AMBULATORY_CARE_PROVIDER_SITE_OTHER): Admitting: Clinical

## 2018-03-13 DIAGNOSIS — F064 Anxiety disorder due to known physiological condition: Secondary | ICD-10-CM

## 2018-03-13 NOTE — BH Specialist Note (Signed)
Integrated Behavioral Health Follow Up Visit  MRN: [id number] Name: Susan George  Number of Integrated Behavioral Health Clinician visits: 5/6 Session Start time: 4:08P Session End time: 4:28P Total time: 20 minutes  Type of Service: Integrated Behavioral Health- Individual/Family Interpretor:No. Interpretor Name and Language: N/A  SUBJECTIVE: Susan George is a 22 y.o. female who came to the appointment alone Patient was referred byDr.Perryfor consistent somatic symptoms Patient reports the following symptoms/concerns: worrying/difficulty relaxing, stress about the future, frustration at herself  Duration of problem: weeks Severity of problem: moderate  OBJECTIVE: Mood: Euthymic and Affect: Appropriate Risk of harm to self or others: Not assessed  LIFE CONTEXT: Family and Social: Pt reported enjoying a trip she went on with [employer] to a history museum. School/Work:Pt reported feeling worried about her performance on a quiz she took earlier in the day. Pt also reported feeling worried about an upcoming presentation. Self-Care: None reported Life Changes: None reported  GOALS ADDRESSED: Patient will reduce symptoms of: anxiety   INTERVENTIONS: Interventions utilized:  Mindfulness or Relaxation Training Standardized Assessments completed: None administered  ASSESSMENT: Patient currently experiencing chronic worry primarily regarding her school performance. Specifically, she mentioned being worried about an upcoming presentation and being frustrated at herself for changing her answers on a quiz she took.   Pt also reported feeling stressed about the future with regards to paying for college. Pt was shown and able to use mindfulness skills in session. She reported that these skills were helpful.Patient may benefit from practicing mindfulness skills (5 senses, categories) to help her manage her anxiety.   PLAN: 1. Follow up with behavioral health clinician on :  03/27/17 @3PM  2. Behavioral recommendations: Practice mindfulness skills as needed 3. Referral(s): [facility]  4. "From scale of 1-10, how likely are you to follow plan?": Not assessed   Luvenia StarchSudheera Ranaweera

## 2018-03-27 ENCOUNTER — Ambulatory Visit (INDEPENDENT_AMBULATORY_CARE_PROVIDER_SITE_OTHER): Admitting: Clinical

## 2018-03-27 DIAGNOSIS — F064 Anxiety disorder due to known physiological condition: Secondary | ICD-10-CM

## 2018-03-27 NOTE — BH Specialist Note (Signed)
Integrated Behavioral Health Follow Up Visit  MRN: [id number] Name: Susan George Butch  Number of Integrated Behavioral Health Clinician visits: 6/6 Session Start time: 2:56PM Session End time:3:26PM Total time: 30 minutes  Type of Service: Integrated Behavioral Health- Individual/Family Interpretor:No. Interpretor Name and Language: N/A  SUBJECTIVE: Susan George is a 22 y.o. female who attended the appointment alone.  Patient was referred by Dr.Perry for consistent somatic symptoms. Patient reports the following symptoms/concerns: worries, feeling panicked, and negative thoughts Duration of problem: weeks; Severity of problem: moderate  OBJECTIVE: Mood: Euthymic and Affect: Appropriate Risk of harm to self or others: Not assessed.   LIFE CONTEXT: Family and Social:Pt reported going to the movies with her sister over spring break.  School/Work: Pt reported having improved her grade in Math. Self-Care: Nothing mentioned Life Changes: None mentioned  GOALS ADDRESSED: Patient will: increase knowledge and/or ability of: self-management skills   INTERVENTIONS: Interventions utilized:  Brief CBT and Psychoeducation and/or Health Education Standardized Assessments completed: None  ASSESSMENT: Patient currently experiencing negative thoughts about herself. Specifically, related to what others might think about her. She also reported comparing herself to others with regards to academic achievement.   Patient was given several handouts about dysfunctional/unhelpful ways of thinking. Patient and intern discussed these handouts. Patient was able to successfully identify thought patterns that she engages in. Intern provided patient with examples of ways to challenge these thoughts. Patient may benefit from challenging her thoughts and replacing them with more adaptive and helpful thoughts.  PLAN: 1. Follow up with behavioral health clinician on : 4/16 Joint visit with Dr. Marina GoodellPerry 2. Behavioral  recommendations: Practice recognizing "shoulds/musts" thoughts,replace them, and work to reduce them 3. Referral(s): [facility] 4. "From scale of 1-10, how likely are you to follow plan?": Not assessed  Luvenia StarchSudheera Ranaweera

## 2018-04-10 ENCOUNTER — Ambulatory Visit (INDEPENDENT_AMBULATORY_CARE_PROVIDER_SITE_OTHER): Admitting: Pediatrics

## 2018-04-10 ENCOUNTER — Ambulatory Visit (INDEPENDENT_AMBULATORY_CARE_PROVIDER_SITE_OTHER): Admitting: Clinical

## 2018-04-10 VITALS — BP 133/79 | HR 110 | Ht 58.07 in | Wt 110.8 lb

## 2018-04-10 DIAGNOSIS — N920 Excessive and frequent menstruation with regular cycle: Secondary | ICD-10-CM

## 2018-04-10 DIAGNOSIS — F064 Anxiety disorder due to known physiological condition: Secondary | ICD-10-CM

## 2018-04-10 DIAGNOSIS — D500 Iron deficiency anemia secondary to blood loss (chronic): Secondary | ICD-10-CM

## 2018-04-10 DIAGNOSIS — G894 Chronic pain syndrome: Secondary | ICD-10-CM

## 2018-04-10 NOTE — Progress Notes (Signed)
THIS RECORD MAY CONTAIN CONFIDENTIAL INFORMATION THAT SHOULD NOT BE RELEASED WITHOUT REVIEW OF THE SERVICE PROVIDER.  Adolescent Medicine Consultation Follow-Up Visit Susan George  is a 22 y.o. female referred by No ref. provider found here today for follow-up.    Previsit planning completed:  yes  Growth Chart Viewed? yes   History was provided by the patient.  PCP Confirmed?  yes  My Chart Activated?   yes   HPI:     Susan George is a 22 y.o. F with PMH significant for anxiety, chronic pain syndrome, and menorrhagia, presenting to adolescent clinic for f/u menorrhagia. She has had iron deficiency anemia 2/2 chronic blood loss. She has had an IUD which was placed by Dr. Marina GoodellPerry in 06/2017. She has continued have significant cramping and persistent spotting since placement; however, saw Dr. Madaline GuthrieSteiner in 02/2018 and hemoglobin was improved to 11.8 g/dL (ferritin [phone]).   Cramping: Even when she is not on her period she has really bad cramping, feels like her uterus is full. She has to void frequently when on her period. That occurs monthly. Pain is largely in groin into her hip. She has chronic back pain and is unsure if some of that is related to cramping. She has cramping with period and even when not on period. Occurs about 3 days per week. She takes Naproxen PRN and heating pads but no improvement.   Bleeding: She continues to have bleeding daily but it remains less than it used to be (about the same as it was in January). She has daily spotting, with periods lasting 1x month. They last up to 10 days during which she has heavy bleeding, and about half of those days are "really heavy." During those days she is changing a pad every hour if not more often.   Chronic pain: [facility] Last seen by him on 02/26/2018. At that time, Gabapentin dose increased gradually to 600 mg TID. She will follow up again on 05/28/2018. She says pain is worse last few months. Missed a day of class  because of pain. Has difficulty focusing on schoolwork after class because of her pain. She is up to higher gabapentin dose w/o improvement except does state headaches are better. It is impacting her school grades.   Anxiety and stress: [facility]. Still having a lot of somatic sx, especially wrist and back. Using cane more than wheelchair. Discussed self care especially with upcoming exams. She reports that she sometimes feels down and depressed 2/2 pain. She takes Cymbalta. Used to be irritable but not really anymore. She feels frustrated at times bc of her pain, wanted to do better in college than HS and feels like she is doing everything she can.   General Health:  She has been having shortness of breath and was prescribed albuterol by her PCP which helps. Using it up to BID at the most. Her chest does not feel tight anymore now with albuterol.   Patient's last menstrual period was 04/04/2018 (exact date). Allergies  Allergen Reactions  . Strawberry Flavor Anaphylaxis   Outpatient Encounter Medications as of 04/10/2018  Medication Sig Note  . acetaminophen (TYLENOL) 500 MG tablet Take 1,000 mg by mouth every 6 (six) hours as needed for mild pain, moderate pain or headache.   . albuterol (PROVENTIL HFA;VENTOLIN HFA) 108 (90 Base) MCG/ACT inhaler Inhale 1-2 puffs into the lungs every 6 (six) hours as needed for wheezing or shortness of breath.   .Marland Kitchen  amitriptyline (ELAVIL) 25 MG tablet Take 50 mg by mouth at bedtime.    Marland Kitchen dexamethasone 0.5 MG/5ML elixir Rinse with 5 mL for 2 minutes and then spit out. Perform 4 times daily until lesions resolve.   . docusate sodium (COLACE) 100 MG capsule Take 100 mg by mouth daily.   . DULoxetine (CYMBALTA) 60 MG capsule Take 60 mg by mouth at bedtime.   . ferrous sulfate 325 (65 FE) MG tablet Take 325 mg by mouth 3 (three) times daily.   Marland Kitchen gabapentin (NEURONTIN) 300 MG capsule Take 300 mg by mouth 3 (three) times daily. 04/10/2018: 600 mg TID  .  hydrocortisone cream 0.5 % Apply 1 application topically 2 (two) times daily as needed for itching.   Marland Kitchen ketoconazole (NIZORAL) 2 % cream Apply to rash daily until disappears   . ketoconazole (NIZORAL) 2 % shampoo Apply 1 application topically 2 (two) times a week.   . lidocaine (LMX) 4 % cream Apply 1 application topically daily as needed (skin).   Marland Kitchen loratadine (CLARITIN) 10 MG tablet Take 10 mg by mouth at bedtime.   . magnesium 30 MG tablet Take 30 mg by mouth every evening.   . naproxen (NAPROSYN) 500 MG tablet Take 1 tablet (500 mg total) by mouth 2 (two) times daily.   . ondansetron (ZOFRAN-ODT) 4 MG disintegrating tablet Take 4 mg by mouth every 8 (eight) hours as needed for nausea or vomiting.   Marland Kitchen oxyCODONE-acetaminophen (PERCOCET/ROXICET) 5-325 MG tablet Take 2 tablets by mouth every 6 (six) hours as needed for severe pain.   . polyethylene glycol powder (GLYCOLAX/MIRALAX) powder Take 17 g by mouth daily. (1 capful)   . Vitamin D, Ergocalciferol, (DRISDOL) 50000 units CAPS capsule Take 50,000 Units by mouth once a week. 08/16/2016: Wednesdays  . [DISCONTINUED] HYDROcodone-acetaminophen (NORCO/VICODIN) 5-325 MG tablet Take 1 tablet by mouth every 6 (six) hours as needed. (Patient not taking: Reported on 01/09/2018)   . [DISCONTINUED] triamcinolone (KENALOG) 0.025 % ointment Apply 1 application topically 2 (two) times daily. (Patient not taking: Reported on 11/28/2017)    No facility-administered encounter medications on file as of 04/10/2018.      Patient Active Problem List   Diagnosis Date Noted  . Anxiety state 02/13/2018  . Anxiety disorder due to medical condition 01/09/2018  . Irritable bowel syndrome with both constipation and diarrhea 08/15/2017  . Rash of face 08/15/2017  . Anemia due to chronic blood loss 05/02/2017  . Chronic pain syndrome 08/20/2016  . Iron deficiency anemia 08/20/2016  . Menorrhagia 08/20/2016  . Polyarthralgia   . Sinus tachycardia 08/16/2016  . 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 Back pain 08/16/2016  . Chest pain 08/16/2016  . SIRS (systemic inflammatory response syndrome) (HCC) 08/16/2016    Social History: No changes since previous visit  The following portions of the patient's history were reviewed and updated as appropriate: allergies, current medications, past medical history, past social history and problem list.  Physical Exam:  Vitals:   04/10/18 1417  BP: 133/79  Pulse: (!) 110  Weight: 110 lb 12.8 oz (50.3 kg)  Height: 4' 10.07" (1.475 m)   BP 133/79   Pulse (!) 110   Ht 4' 10.07" (1.475 m)   Wt 110 lb 12.8 oz (50.3 kg)   LMP 04/04/2018 (Exact Date)   BMI 23.10 kg/m  Body mass index: body mass index is 23.1 kg/m. Blood pressure percentiles are not available for patients who are 18 years or older.  Physical Exam  Constitutional: She appears well-developed and well-nourished. No distress.  HENT:  Head: Normocephalic and atraumatic.  Mouth/Throat: Oropharynx is clear and moist. No oropharyngeal exudate.  Eyes: Pupils are equal, round, and reactive to light. EOM are normal. Right eye exhibits no discharge. Left eye exhibits no discharge.  Neck: Normal range of motion. Neck supple. No thyromegaly present.  Cardiovascular: Normal rate, regular rhythm, normal heart sounds and intact distal pulses. Exam reveals no gallop and no friction rub.  No murmur heard. Pulmonary/Chest: Breath sounds normal. No respiratory distress. She has no wheezes. She has no rales.  Abdominal: Soft. She exhibits no distension. There is no rebound and no guarding.  Diffusely tender to palpation  Musculoskeletal: She exhibits no edema or deformity.  Back is tender to palpation in thoracolumbar paraspinal distribution  Lymphadenopathy:    She has no cervical adenopathy.  Neurological: She is alert. She has normal reflexes. She exhibits normal muscle tone.  Skin: Skin is warm and dry.    Assessment/Plan: 1. Menorrhagia with regular  cycle - 22 y.o. F with h/o menorrhagia s/p IUD placement in 06/2017 with subsequent improvement. Bleeding has been improved but she still has spotting daily with monthly heavy periods lasting 10 days with 5 days of heavy bleeding. Improved hgb at last visit with Dr. Madaline Guthrie 1 month ago. No changes made today. Will continue to monitor.   2. Anemia due to chronic blood loss - As above, improvement in hemoglobin when checked 1 month ago.   3. Anxiety disorder due to medical condition - Saw [facility] today, recommendations provided to improve relaxation and schoolwork. F/u scheduled for 4/23.    4. Chronic pain syndrome - Followed by Dr. Madaline Guthrie at [facility]. Next visit in 6 weeks.    Follow-up:  Return for 3 months for f/u menorrhagia.   Medical decision-making:  > 30 minutes spent, more than 50% of appointment was spent discussing diagnosis and management of symptoms

## 2018-04-10 NOTE — Patient Instructions (Signed)
Turn in your service dog form Come see us in 3 months  Let us know if you need us

## 2018-04-10 NOTE — BH Specialist Note (Signed)
Integrated Behavioral Health Follow Up Visit  MRN: [id number] Name: Susan PriesKaelyn Wertheim  Number of Integrated Behavioral Health Clinician visits: 7 Session Start time: 2:27P Session End time: 2:47P Total time: 20 minutes  Type of Service: Integrated Behavioral Health- Individual/Family Interpretor:No. Interpretor Name and Language:n/a  SUBJECTIVE: Susan George is a 22 y.o. female came alone Patient was referred by Dr. Marina GoodellPerry for consistent somatic symptoms. Patient reports the following symptoms/concerns: feeling stressed out, having aches and pains, feeling bad about self Duration of problem: weeks Severity of problem: moderate  OBJECTIVE: Mood: Euthymic and Affect: Appropriate Risk of harm to self or others: Not assessed  LIFE CONTEXT: Family and Social:Pt reported feeling guilty when not doing work and doing social activities. She is planning to go back home to [place] after her [place] exam. Pt's mom is coming to starting moving pt's stuff out of her dorm room.  School/Work: Pt reports feeling stressed out due to classes and class work. She is having a difficult time doing her schoolwork to the best of her abilities due to her regular aches and pains Self-Care: Listening to music, playing the SIMS, watching crime dramas, adult coloring books, aromatherapy  Life Changes: None noted.   GOALS ADDRESSED: Patient will: 1.  Increase knowledge and/or ability of: stress reduction   INTERVENTIONS: Interventions utilized:  Solution-Focused Strategies Standardized Assessments completed: None given  ASSESSMENT: Patient currently experiencing school-related stress. Specifically, pt mentioned her somatic symptoms interfering with her ability to perform  her best in her courses.    Patient may benefit from practicing self-care.Particularly, as her semester is wrapping up and she is having final exams.    PLAN: 1. Follow up with behavioral health clinician on : 4/23 2. Behavioral  recommendations: Use diffuser while doing schoolwork and do self-care (I.e. listening to music) for at least 3-5 minutes a day.  3. Referral(s): [facility] 4. "From scale of 1-10, how likely are you to follow plan?": Not assessed.   Luvenia StarchSudheera Ranaweera

## 2018-04-17 ENCOUNTER — Ambulatory Visit (INDEPENDENT_AMBULATORY_CARE_PROVIDER_SITE_OTHER): Admitting: Clinical

## 2018-04-17 DIAGNOSIS — F064 Anxiety disorder due to known physiological condition: Secondary | ICD-10-CM

## 2018-04-17 NOTE — BH Specialist Note (Signed)
Integrated Behavioral Health Follow Up Visit  MRN: [id number] Name: Susan George  Number of Integrated Behavioral Health Clinician visits: 8 Session Start time: 2:55PM  Session End time: 3:22PM Total time: 27 minutes   Type of Service: Integrated Behavioral Health- Individual/Family Interpretor:No. Interpretor Name and Language: N/A  SUBJECTIVE: Susan PriesKaelyn Bowdoin is a 22 y.o. female came alone. Patient was referred by Dr. Marina GoodellPerry for consistent somatic symptoms.  Patient reports the following symptoms/concerns: school-related stress, racing thoughts, and worries Duration of problem: weeks Severity of problem: moderate  OBJECTIVE: Mood: Euthymic and Affect: Appropriate Risk of harm to self or others: Not assessed  LIFE CONTEXT: Family and Social: Pt plans to go to [place] for her mother's Masters graduation ceremony. Pt reports feeling worried about driving back with her grandparents who she doesn't like.  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 She is stressed about passing her Stats class.  Self-Care: Pt has been listening to music while doing math HW. She also reported making a stress ball using essential oils which has been helping her.   Life Changes: Pt will be moving out of her dorm (where she lives alone) and living back at home with her family for the summer.   GOALS ADDRESSED: Patient will: 1.  Reduce symptoms of: anxiety  2.  Increase knowledge and/or ability of: stress reduction   INTERVENTIONS: Interventions utilized:  Solution-Focused Strategies Standardized Assessments completed: None given  ASSESSMENT: Patient currently experiencing school-related stress.   Patient may benefit from from categorizing (important and urgent, important and non-urgent, not important and urgent, not important and non-urgent) the tasks/thoughts that arise for her in a given day.   PLAN: 1. Follow up with behavioral health clinician on : 05/01/18 2. Behavioral  recommendations: Practice categorizing thoughts/task in the morning and before going to sleep.   3. Referral(s): [facility] 4. "From scale of 1-10, how likely are you to follow plan?": 6  Sudheera Ranaweera

## 2018-05-01 ENCOUNTER — Ambulatory Visit (INDEPENDENT_AMBULATORY_CARE_PROVIDER_SITE_OTHER): Admitting: Clinical

## 2018-05-01 DIAGNOSIS — F064 Anxiety disorder due to known physiological condition: Secondary | ICD-10-CM

## 2018-05-01 NOTE — BH Specialist Note (Signed)
Integrated Behavioral Health Follow Up Visit  MRN: [id number] Name: Susan George  Number of Integrated Behavioral Health Clinician visits: 9 Session Start time: 3:49PM Session End time: 4:22PM Total time: 33 minutes  Type of Service: Integrated Behavioral Health- Individual/Family Interpretor:No. Interpretor Name and Language: an/a  SUBJECTIVE: Susan George is a 22 y.o. female who arrived alone Patient was referred by Dr.Perry for consistent somatic symptoms  Patient reports the following symptoms/concerns: irritability with friends and family and some school related   Duration of problem: weeks Severity of problem: moderate  OBJECTIVE: Mood: Euthymic and Affect: Appropriate Risk of harm to self or others: Not assessed  LIFE CONTEXT: Family and Social: Pt expressed feeling irritable towards friends and family. Particularly in situations where her boundaries are crossed.  School/Work: Pt is taking her finals exam is moving out of her dorm on 5/9.  Self-Care: None noted. Life Changes: Moving back home for the summer. Pt is planning to see a therapist back home.   GOALS ADDRESSED: Patient will:  1.  Increase knowledge and/or ability of: boundary setting   INTERVENTIONS: Interventions utilized:  Solution-Focused Strategies and Brief CBT Standardized Assessments completed: None given.  ASSESSMENT: Patient currently experiencing feelings of irritability towards her family and friends. This feelings appear to come up for patient when she feels like her boundaries are being crossed. Patient was able to generate some physical, mental, emotional, spiritual, and time boundaries.   Patient may benefit from continuing to identify her personal boundaries and working to enforce them.  PLAN: 1. Follow up with behavioral health clinician on : As needed. 2. Behavioral recommendations: Continue practicing identifying boundaries and enforcing them. Practice mindfulness exercises and  identifying cognitive distortions.  3. Referral(s): Integrated [facility] (In Clinic) 4. "From scale of 1-10, how likely are you to follow plan?": Not assessed.  Luvenia Starch

## 2018-05-15 ENCOUNTER — Encounter: Payer: Self-pay | Admitting: Pediatrics

## 2018-05-31 IMAGING — CR DG CHEST 2V
2 series · 2 of 2 positions shown · non-contrast
Comparison: None.

CLINICAL DATA: Shortness of breath

EXAM:
CHEST  2 VIEW

[w chest lat]
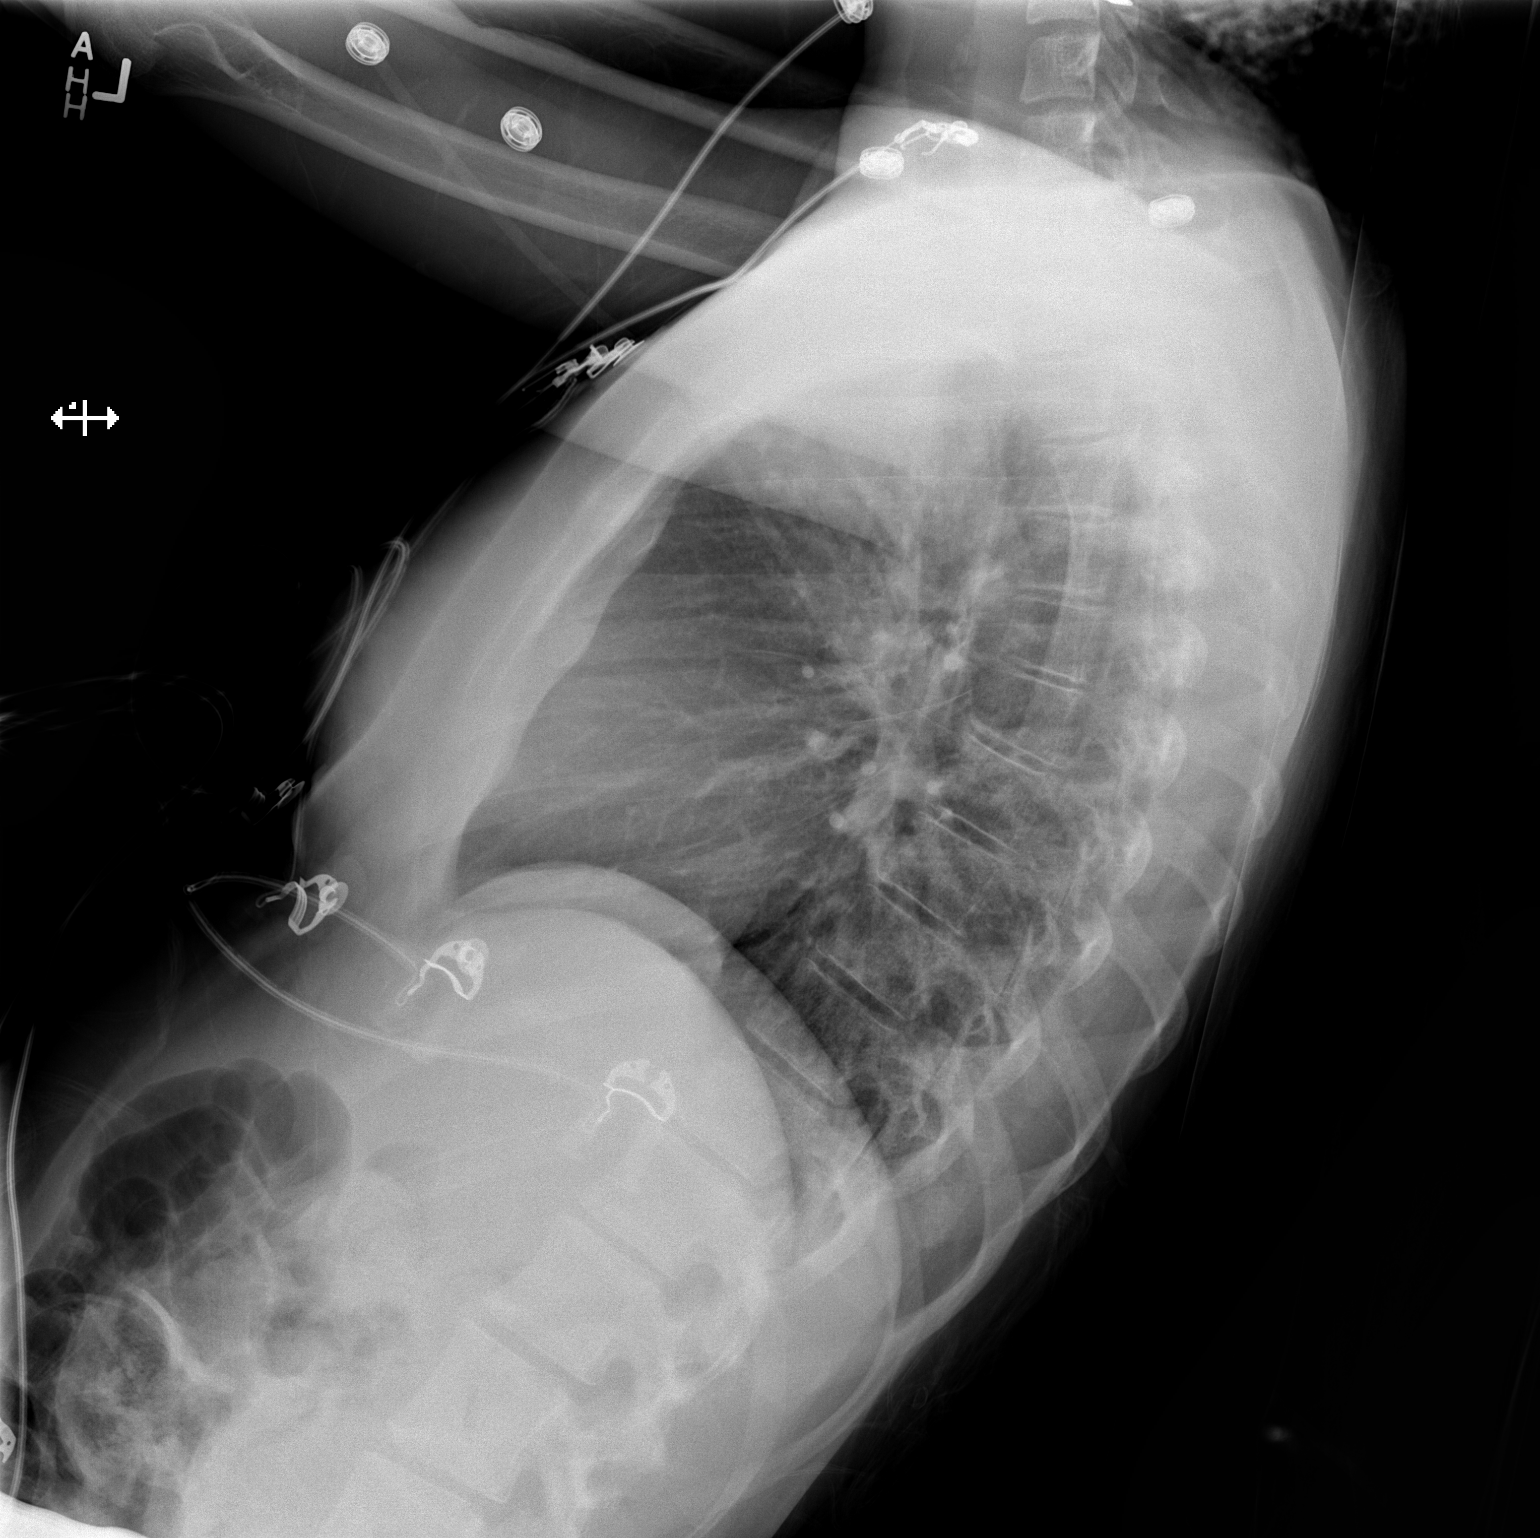

[x chest ap]
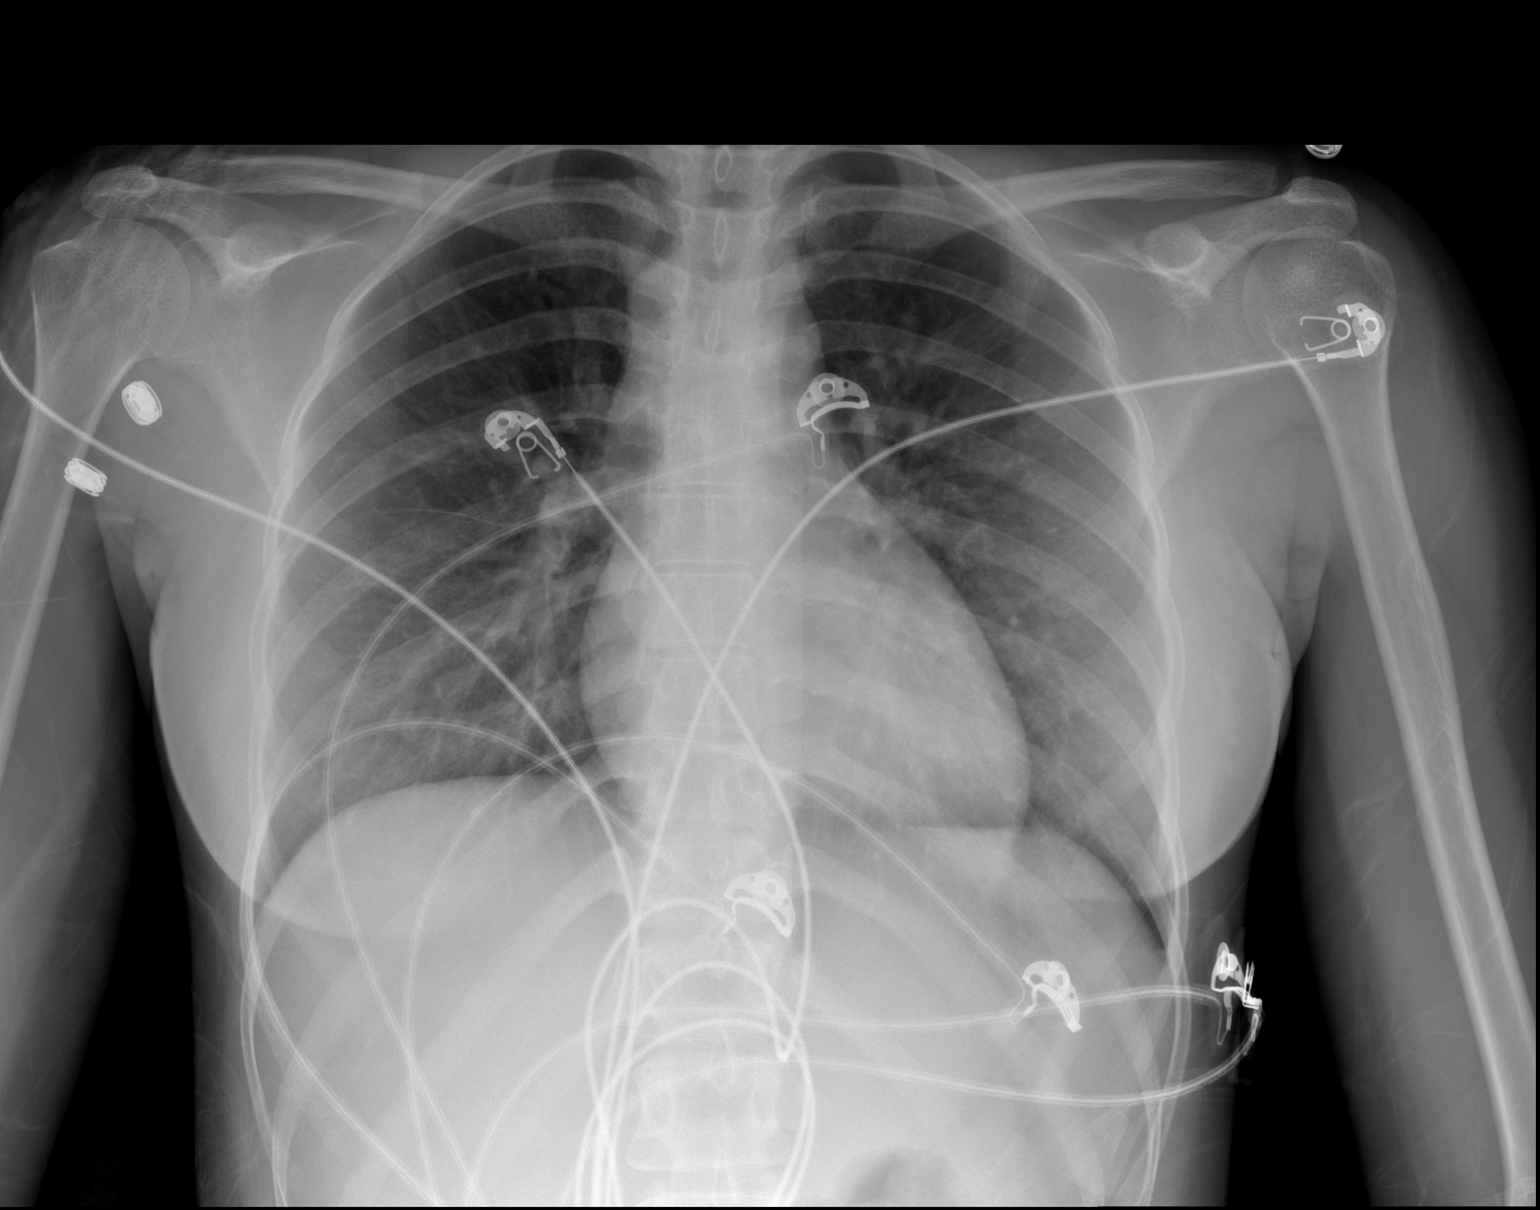

[2 of 2 positions shown; findings below may reference images not displayed]

FINDINGS: Lungs are clear.  No pleural effusion or pneumothorax.

The heart is normal in size.

Visualized osseous structures are within normal limits.
IMPRESSION: Normal chest radiographs.

## 2018-07-03 ENCOUNTER — Ambulatory Visit: Payer: Self-pay

## 2018-07-10 ENCOUNTER — Ambulatory Visit: Payer: Self-pay

## 2018-09-04 ENCOUNTER — Encounter: Payer: Self-pay | Admitting: *Deleted

## 2018-09-04 ENCOUNTER — Ambulatory Visit (INDEPENDENT_AMBULATORY_CARE_PROVIDER_SITE_OTHER): Admitting: Pediatrics

## 2018-09-04 VITALS — BP 101/66 | HR 109 | Ht 58.27 in | Wt 102.0 lb

## 2018-09-04 DIAGNOSIS — Z113 Encounter for screening for infections with a predominantly sexual mode of transmission: Secondary | ICD-10-CM

## 2018-09-04 DIAGNOSIS — N921 Excessive and frequent menstruation with irregular cycle: Secondary | ICD-10-CM

## 2018-09-04 DIAGNOSIS — Z3202 Encounter for pregnancy test, result negative: Secondary | ICD-10-CM | POA: Diagnosis not present

## 2018-09-04 DIAGNOSIS — Z130 Encounter for screening for diseases of the blood and blood-forming organs and certain disorders involving the immune mechanism: Secondary | ICD-10-CM | POA: Diagnosis not present

## 2018-09-04 DIAGNOSIS — L853 Xerosis cutis: Secondary | ICD-10-CM

## 2018-09-04 LAB — POCT HEMOGLOBIN: HEMOGLOBIN: 12.4 g/dL (ref 12.2–16.2)

## 2018-09-04 LAB — POCT URINE PREGNANCY: Preg Test, Ur: NEGATIVE

## 2018-09-04 MED ORDER — TRANEXAMIC ACID 650 MG PO TABS: 1300.0000 mg | ORAL_TABLET | Freq: Three times a day (TID) | ORAL | 0 refills | Status: DC

## 2018-09-04 MED ORDER — FLUOCINOLONE ACETONIDE BODY 0.01 % EX OIL: TOPICAL_OIL | CUTANEOUS | 1 refills | Status: DC

## 2018-09-04 NOTE — Progress Notes (Signed)
THIS RECORD MAY CONTAIN CONFIDENTIAL INFORMATION THAT SHOULD NOT BE RELEASED WITHOUT REVIEW OF THE SERVICE PROVIDER.  Adolescent Medicine Consultation Follow-Up Visit Susan George  is a 22 y.o. female referred by No ref. provider found here today for follow-up regarding menorrhagia.    Last seen in Adolescent Medicine Clinic on 04/10/2018 for menorrhagia, chronic pain and depressive symptoms.  Plan at last visit included to follow with Dr. Madaline Guthrie at [facility] for chronic pain and no changes to her treatment for menorrhagia, as she was improved s/p IUD placement.  - Pertinent Labs? Yes - Growth Chart Viewed? yes   History was provided by the patient.  PCP Confirmed?  yes  My Chart Activated?   yes   Chief Complaint  Patient presents with  . Follow-up    HPI:    Bleeding: She reports today that she is bleeding every day, but that amount she has been bleeding has increased over the last month. Now she is utilizing more than 4 pads per day. Each pad is soaked and heavy. She has been unable to identify when she is having her period because she has been bleeding so much. The last definite period she had was in July.   Hg 05/28/2018 at [facility] visit was within normal range at 12.1   Chronic pain: Followed by Dr. Madaline Guthrie at [facility] Last seen by him on 05/28/2018. No medication changes were made, but it was recommended that she restart PT to cope with worsening pain. Since that time, she reports that she was unable to get that approved by [employer]. However, she has been using an exercise chair with bands. She feels that her pain is worse than it previously was.  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 She reports that she has missed 1 class so far due to pain  She next sees Dr. Madaline Guthrie next Monday  Bug Bite She was outside last night and had a small bite. When she woke up this morning,   Stomach Cramps and Emesis She had a UTI over the summer  and reports that, after that, eating has become very painful. Pain is periumbilical. She also has nausea. She was given zofran and it helps with the nausea but not the abdominal pain. Pain is only present after she eats or drinks. Episodes last up to 30 minutes at a time. It woke her up from sleep occasionally, usually if she eats right before going to bed. Pain is associated with urgency to pass stool but is not relieved by passing stool. She has not lost weight, had fever, joint pains or skin rashes. She is currently on a 53-month trial on pantoprazole and is being followed by a GI physician in [place].   Dry Skin She is current using a lotion that she She uses Cetaphil to wash her face For shampoo, she is using Ketoconazole shampoo on her scalp and sulfur-A  No LMP recorded. (Menstrual status: Irregular Periods). Allergies  Allergen Reactions  . Strawberry Flavor Anaphylaxis   Outpatient Medications Prior to Visit  Medication Sig Dispense Refill  . acetaminophen (TYLENOL) 500 MG tablet Take 1,000 mg by mouth every 6 (six) hours as needed for mild pain, moderate pain or headache.    . albuterol (PROVENTIL HFA;VENTOLIN HFA) 108 (90 Base) MCG/ACT inhaler Inhale 1-2 puffs into the lungs every 6 (six) hours as needed for wheezing or shortness of breath.    Marland Kitchen amitriptyline (ELAVIL) 25 MG tablet  Take 50 mg by mouth at bedtime.     Marland Kitchen dexamethasone 0.5 MG/5ML elixir Rinse with 5 mL for 2 minutes and then spit out. Perform 4 times daily until lesions resolve.    . docusate sodium (COLACE) 100 MG capsule Take 100 mg by mouth daily.    . DULoxetine (CYMBALTA) 60 MG capsule Take 60 mg by mouth at bedtime.    . ferrous sulfate 325 (65 FE) MG tablet Take 325 mg by mouth 3 (three) times daily.    Marland Kitchen gabapentin (NEURONTIN) 300 MG capsule Take 800 mg by mouth 3 (three) times daily.     . hydrocortisone cream 0.5 % Apply 1 application topically 2 (two) times daily as needed for itching.    Marland Kitchen ketoconazole  (NIZORAL) 2 % cream Apply to rash daily until disappears 15 g 0  . ketoconazole (NIZORAL) 2 % shampoo Apply 1 application topically 2 (two) times a week. 120 mL 0  . lidocaine (LMX) 4 % cream Apply 1 application topically daily as needed (skin). 30 g 0  . loratadine (CLARITIN) 10 MG tablet Take 10 mg by mouth at bedtime.    . magnesium 30 MG tablet Take 30 mg by mouth every evening.    . ondansetron (ZOFRAN-ODT) 4 MG disintegrating tablet Take 4 mg by mouth every 8 (eight) hours as needed for nausea or vomiting.    Marland Kitchen oxyCODONE-acetaminophen (PERCOCET/ROXICET) 5-325 MG tablet Take 2 tablets by mouth every 6 (six) hours as needed for severe pain. 20 tablet 0  . polyethylene glycol powder (GLYCOLAX/MIRALAX) powder Take 17 g by mouth daily. (1 capful) 500 g 11  . Vitamin D, Ergocalciferol, (DRISDOL) 50000 units CAPS capsule Take 50,000 Units by mouth once a week.    . naproxen (NAPROSYN) 500 MG tablet Take 1 tablet (500 mg total) by mouth 2 (two) times daily. (Patient not taking: Reported on 09/04/2018) 10 tablet 0   No facility-administered medications prior to visit.      Patient Active Problem List   Diagnosis Date Noted  . Anxiety state 02/13/2018  . Anxiety disorder due to medical condition 01/09/2018  . Irritable bowel syndrome with both constipation and diarrhea 08/15/2017  . Rash of face 08/15/2017  . Anemia due to chronic blood loss 05/02/2017  . Chronic pain syndrome 08/20/2016  . Iron deficiency anemia 08/20/2016  . Menorrhagia 08/20/2016  . Polyarthralgia   . Sinus tachycardia 08/16/2016  . 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 Back pain 08/16/2016  . Chest pain 08/16/2016  . SIRS (systemic inflammatory response syndrome) (HCC) 08/16/2016   The following portions of the patient's history were reviewed and updated as appropriate: allergies, current medications, past medical history, past social history and problem list.  Physical Exam:  Vitals:   09/04/18 1427  BP: 101/66   Pulse: (!) 109  Weight: 102 lb (46.3 kg)  Height: 4' 10.27" (1.48 m)   BP 101/66   Pulse (!) 109   Ht 4' 10.27" (1.48 m)   Wt 102 lb (46.3 kg)   BMI 21.12 kg/m  Body mass index: body mass index is 21.12 kg/m. Growth percentile SmartLinks can only be used for patients less than 22 years old.  Physical Exam  General: well-nourished, in NAD HEENT: [place]/AT, PERRL, EOMI, no conjunctival injection, mucous membranes moist, oropharynx clear Neck: full ROM, supple Lymph nodes: no cervical lymphadenopathy Chest: lungs CTAB, no nasal flaring or grunting, no increased work of breathing, no retractions Heart: RRR, no m/r/g Abdomen: soft, nontender, nondistended, no  hepatosplenomegaly Extremities: Cap refill <3s Musculoskeletal: full ROM in 4 extremities, moves all extremities equally Neurological: alert and active Skin: no rash    Assessment/Plan:  Menorrhagia - reported increase in symptoms but anemia continues to be improved from last evaluation - Will prescribe Lysteda for 1 week course - POC Hg today: 12.4 - Dr. Marina Goodell to speak with Dr. Madaline Guthrie about additional evaluation of platelet function  Chronic Pain - complaining of abdominal pain today that has failed 69-month PPI trial and s/p normal structural evaluation; pain sounds functional in nature - Follow up with Dr. Madaline Guthrie next Monday - Encouraged patient to talk at that appointment about her abdominal pain, as it sounds highly functional in nature  Dry Skin - Recommend her to wear Vaseline every night at bed - Recommend that patient avoid anti-dandruff lotion on skin and instead recommend mild unscented soap - Will try triamcinolone oil for scalp dryness - She would benefit from evaluation by a dermatologist; recommend that she seek that referral from her PCP  Follow-up:  Return in about 3 months (around 12/04/2018) for menorrhagia follow up.   Medical decision-making:  >25 minutes spent face to face with patient with more  than 50% of appointment spent discussing diagnosis, management, follow-up, and reviewing of menorrhagia.

## 2018-09-04 NOTE — Patient Instructions (Signed)
We will prescribe 1 week of Lystreda for your bleeding  Please tell Dr. Madaline Guthrie about your abdominal pain  We have sent a prescription oil for your scalp to the pharmacy.   Please return in 3 months, or sooner if the bleeding worsens again

## 2018-09-05 LAB — C. TRACHOMATIS/N. GONORRHOEAE RNA
C. trachomatis RNA, TMA: NOT DETECTED
N. gonorrhoeae RNA, TMA: NOT DETECTED

## 2018-10-08 ENCOUNTER — Telehealth: Payer: Self-pay

## 2018-10-08 NOTE — Telephone Encounter (Signed)
-----   Message from Owens Shark, MD sent at 10/05/2018  5:39 PM EDT ----- Please call this patient to schedule an appointment with Graylin Shiver or me as soon as possible. Thanks!

## 2018-10-08 NOTE — Telephone Encounter (Signed)
Called all numbers on file and left VM's asking for mother or patient to call office to get an appointment scheduled ASAP.

## 2018-10-11 ENCOUNTER — Ambulatory Visit: Payer: Self-pay | Admitting: Family

## 2018-10-19 ENCOUNTER — Ambulatory Visit (INDEPENDENT_AMBULATORY_CARE_PROVIDER_SITE_OTHER): Admitting: Family

## 2018-10-19 ENCOUNTER — Encounter: Payer: Self-pay | Admitting: Family

## 2018-10-19 VITALS — Ht 59.0 in | Wt 100.6 lb

## 2018-10-19 DIAGNOSIS — G894 Chronic pain syndrome: Secondary | ICD-10-CM

## 2018-10-19 DIAGNOSIS — K582 Mixed irritable bowel syndrome: Secondary | ICD-10-CM

## 2018-10-19 MED ORDER — HYOSCYAMINE SULFATE 0.125 MG SL SUBL: 0.1250 mg | SUBLINGUAL_TABLET | SUBLINGUAL | 0 refills | Status: DC | PRN

## 2018-10-19 NOTE — Progress Notes (Signed)
History was provided by the patient.  Susan George is a 22 y.o. female who is here for follow up with abdominal pain in the context of IBS and chronic pain syndrome.   PCP confirmed? No.  System, Pcp Not In  HPI:   -taking hyoscyamine 0.125 mg SL x 2 with no relief  -tried for about 2 weeks with no relief -last dose was early last week  -small emesis early last week, no blood or cough grounds -sometimes diarrhea after she ate -notices blood streaked on her stool, every time since beginning of this month -stooling once per day with straining sometimes, always painful -water intake: less than 60 ounces per day  -dry mouth  -taking zofran at least twice per day , tried phenergan with no benefit  -cut out bread and that has helped with pain   Review of Systems  Constitutional: Negative for chills, fever and malaise/fatigue.  HENT: Negative for sore throat.   Respiratory: Negative for cough.   Cardiovascular: Negative for chest pain.  Gastrointestinal: Positive for abdominal pain, blood in stool, constipation, diarrhea and nausea.  Genitourinary: Negative for dysuria and frequency.  Skin: Negative for rash.  Neurological: Negative for dizziness and headaches.    Patient Active Problem List   Diagnosis Date Noted  . Anxiety state 02/13/2018  . Anxiety disorder due to medical condition 01/09/2018  . Irritable bowel syndrome with both constipation and diarrhea 08/15/2017  . Rash of face 08/15/2017  . Anemia due to chronic blood loss 05/02/2017  . Chronic pain syndrome 08/20/2016  . Iron deficiency anemia 08/20/2016  . Menorrhagia 08/20/2016  . Polyarthralgia   . Sinus tachycardia 08/16/2016  . 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 Back pain 08/16/2016  . Chest pain 08/16/2016  . SIRS (systemic inflammatory response syndrome) (HCC) 08/16/2016    Current Outpatient Medications on File Prior to Visit  Medication Sig Dispense Refill  . acetaminophen (TYLENOL) 500 MG tablet Take  1,000 mg by mouth every 6 (six) hours as needed for mild pain, moderate pain or headache.    . albuterol (PROVENTIL HFA;VENTOLIN HFA) 108 (90 Base) MCG/ACT inhaler Inhale 1-2 puffs into the lungs every 6 (six) hours as needed for wheezing or shortness of breath.    Marland Kitchen amitriptyline (ELAVIL) 25 MG tablet Take 50 mg by mouth at bedtime.     . cyclobenzaprine (FLEXERIL) 10 MG tablet Take 10 mg by mouth daily.    Marland Kitchen dexamethasone 0.5 MG/5ML elixir Rinse with 5 mL for 2 minutes and then spit out. Perform 4 times daily until lesions resolve.    . docusate sodium (COLACE) 100 MG capsule Take 100 mg by mouth daily.    . DULoxetine (CYMBALTA) 60 MG capsule Take 60 mg by mouth at bedtime.    . ferrous sulfate 325 (65 FE) MG tablet Take 325 mg by mouth 3 (three) times daily.    . Fluocinolone Acetonide Body (DERMA-SMOOTHE/FS BODY) 0.01 % OIL Apply to scalp at night as needed for dry scalp. Wash off in the morning. 118.28 mL 1  . gabapentin (NEURONTIN) 300 MG capsule Take 800 mg by mouth 3 (three) times daily.     . hydrocortisone cream 0.5 % Apply 1 application topically 2 (two) times daily as needed for itching.    Marland Kitchen ketoconazole (NIZORAL) 2 % cream Apply to rash daily until disappears 15 g 0  . ketoconazole (NIZORAL) 2 % shampoo Apply 1 application topically 2 (two) times a week. 120 mL 0  . lidocaine (  LMX) 4 % cream Apply 1 application topically daily as needed (skin). 30 g 0  . loratadine (CLARITIN) 10 MG tablet Take 10 mg by mouth at bedtime.    . magnesium 30 MG tablet Take 30 mg by mouth every evening.    . ondansetron (ZOFRAN-ODT) 4 MG disintegrating tablet Take 4 mg by mouth every 8 (eight) hours as needed for nausea or vomiting.    . polyethylene glycol powder (GLYCOLAX/MIRALAX) powder Take 17 g by mouth daily. (1 capful) 500 g 11  . tranexamic acid (LYSTEDA) 650 MG TABS tablet Take 2 tablets (1,300 mg total) by mouth 3 (three) times daily. 42 tablet 0  . Vitamin D, Ergocalciferol, (DRISDOL) 50000  units CAPS capsule Take 50,000 Units by mouth once a week.    . naproxen (NAPROSYN) 500 MG tablet Take 1 tablet (500 mg total) by mouth 2 (two) times daily. (Patient not taking: Reported on 09/04/2018) 10 tablet 0  . oxyCODONE-acetaminophen (PERCOCET/ROXICET) 5-325 MG tablet Take 2 tablets by mouth every 6 (six) hours as needed for severe pain. (Patient not taking: Reported on 10/19/2018) 20 tablet 0   No current facility-administered medications on file prior to visit.     Allergies  Allergen Reactions  . Strawberry Flavor Anaphylaxis    Physical Exam:    Vitals:   10/19/18 0945  Weight: 100 lb 9.6 oz (45.6 kg)  Height: 4\' 11"  (1.499 m)    Growth percentile SmartLinks can only be used for patients less than 36 years old. No LMP recorded. (Menstrual status: Irregular Periods).  Physical Exam  Constitutional: No distress.  Walking without assistive device  HENT:  Mouth/Throat: No oropharyngeal exudate.  Eyes: Pupils are equal, round, and reactive to light. EOM are normal.  Corrective lenses  Neck: Normal range of motion.  Cardiovascular: Normal rate.  No murmur heard. Pulmonary/Chest: Effort normal.  Abdominal: Soft. Bowel sounds are normal. She exhibits no distension and no mass. There is tenderness (RLQ, LLQ with deep palpation). There is no rebound and no guarding.  Neurological: She is alert.  Skin: Skin is warm and dry. No rash noted.     Assessment/Plan: 1. Irritable bowel syndrome with both constipation and diarrhea -continue with hyoscyamine SL 0.125 mg under tongue every 4 hours as needed.  -reviewed more consistent use of medication q 4 hrs for relief; likely did not benefit from medication due to only taking twice in one day.  -return precautions given   2. Chronic pain syndrome -as above, no changes in medication therapy today as she is stable with no acute abdomen symptoms.

## 2018-11-01 ENCOUNTER — Encounter: Payer: Self-pay | Admitting: Family

## 2018-12-04 ENCOUNTER — Ambulatory Visit (INDEPENDENT_AMBULATORY_CARE_PROVIDER_SITE_OTHER): Admitting: Pediatrics

## 2018-12-04 ENCOUNTER — Ambulatory Visit: Payer: Self-pay

## 2018-12-04 ENCOUNTER — Other Ambulatory Visit: Payer: Self-pay

## 2018-12-04 ENCOUNTER — Encounter: Payer: Self-pay | Admitting: Family

## 2018-12-04 ENCOUNTER — Ambulatory Visit (INDEPENDENT_AMBULATORY_CARE_PROVIDER_SITE_OTHER): Admitting: Clinical

## 2018-12-04 VITALS — BP 109/68 | HR 77 | Ht 58.86 in | Wt 100.2 lb

## 2018-12-04 DIAGNOSIS — Z789 Other specified health status: Secondary | ICD-10-CM

## 2018-12-04 DIAGNOSIS — K582 Mixed irritable bowel syndrome: Secondary | ICD-10-CM

## 2018-12-04 DIAGNOSIS — G894 Chronic pain syndrome: Secondary | ICD-10-CM | POA: Diagnosis not present

## 2018-12-04 DIAGNOSIS — D500 Iron deficiency anemia secondary to blood loss (chronic): Secondary | ICD-10-CM

## 2018-12-04 DIAGNOSIS — F064 Anxiety disorder due to known physiological condition: Secondary | ICD-10-CM

## 2018-12-04 DIAGNOSIS — N920 Excessive and frequent menstruation with regular cycle: Secondary | ICD-10-CM | POA: Diagnosis not present

## 2018-12-04 MED ORDER — BUSPIRONE HCL 5 MG PO TABS: 5.0000 mg | ORAL_TABLET | Freq: Three times a day (TID) | ORAL | 1 refills | Status: DC

## 2018-12-04 NOTE — Patient Instructions (Addendum)
Thanks for visiting adolescent clinic. We are glad your abdominal pain is improving!! We recommend the following:  -Start buspar (buspirone) for anxiety, call us if you don't feel like it is working and we can adjust dose -Continue all other medications, including levsin as needed for abdominal pain -Work on mindfulness techniques that were reviewed  Follow up on January 15th

## 2018-12-04 NOTE — Progress Notes (Signed)
History was provided by the patient.  Susan George is a 22 y.o. female who is here for follow up.   HPI:  Since last visit, has been doing gluten free diet, abdominal pain resolved. Eating lots of fruits and vegetables, some beef. Eats small meals throughout the day. Having stools every other day, not as hard as they used to be. Using miralax every day now. Using hyoscyamine every 4hrs after last visit, but now that stomach pain has improved, has reduced to as needed, usually twice a day. Occasional nausea, but bearable, and zofran seems to work better now. Better class attendance now because of improved pain. Worst pain is 8/10. Only 2/7 days does she have severe pain, improved from 7/7 days.  Says she still is in a lot of pain, but working with pain management to improve. Meds as below.  Still feels anxious most days. Increases when around lots of people. Sometimes can just feel really anxious when she is sitting there. Denies rapid heart rate or hyperventilation episodes. Continues cymbalta, 32m x 1 year. Not depressed. Not going to therapy right now because of school schedule. School therapist tried but no very helpful. Last time of therapy was May 2019. Would like to start therapy again here. No thoughts of harm or SI.  Periods every month, lasts 7-10 days, mod-severe cramping, takes tylenol. Has Mirena IUD.  Susan George last seen in adolescent clinic on 10/19/18 - recommended to continue hyoscyamine q4hr PRN. No other changes in meds. Telephone call to give advice on elimination diet if pt wanted to try if she was concerned about food sensitivities.  Review of Systems  Constitutional: Negative for chills, fever, malaise/fatigue and weight loss.  HENT: Negative for ear pain and sore throat.   Eyes: Negative for blurred vision, double vision and pain.  Respiratory: Negative for cough, shortness of breath and wheezing.   Cardiovascular: Negative for chest pain and palpitations.   Gastrointestinal: Positive for abdominal pain and nausea. Negative for blood in stool, constipation, diarrhea, heartburn and vomiting.  Genitourinary: Negative for dysuria, frequency and urgency.  Musculoskeletal: Positive for back pain, joint pain, myalgias and neck pain.  Skin: Negative for rash.  Neurological: Positive for weakness. Negative for dizziness, loss of consciousness and headaches.  Psychiatric/Behavioral: Negative for depression, hallucinations and suicidal ideas. The patient is nervous/anxious.   All other systems reviewed and are negative.    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 Graduating undergrad early then plans to take a gap year.  Patient Active Problem List   Diagnosis Date Noted  . Anxiety state 02/13/2018  . Anxiety disorder due to medical condition 01/09/2018  . Irritable bowel syndrome with both constipation and diarrhea 08/15/2017  . Rash of face 08/15/2017  . Anemia due to chronic blood loss 05/02/2017  . Chronic pain syndrome 08/20/2016  . Iron deficiency anemia 08/20/2016  . Menorrhagia 08/20/2016  . Polyarthralgia   . Sinus tachycardia 08/16/2016  . Behcet's disease ([facility] 08/16/2016  . Back pain 08/16/2016  . Chest pain 08/16/2016  . SIRS (systemic inflammatory response syndrome) ([facility] [place] 08/16/2016    Current Outpatient Medications on File Prior to Visit  Medication Sig Dispense Refill  . acetaminophen (TYLENOL) 500 MG tablet Take 1,000 mg by mouth every 6 (six) hours as needed for mild pain, moderate pain or headache.    . albuterol (PROVENTIL HFA;VENTOLIN HFA) 108 (90 Base) MCG/ACT inhaler Inhale 1-2 puffs into the lungs every 6 (six) hours as needed for wheezing  or shortness of breath.    . cyclobenzaprine (FLEXERIL) 10 MG tablet Take 10 mg by mouth daily.    Marland Kitchen dexamethasone 0.5 MG/5ML elixir Rinse with 5 mL for 2 minutes and then spit out. Perform 4 times daily until lesions resolve.    . docusate sodium (COLACE)  100 MG capsule Take 100 mg by mouth daily.    . DULoxetine (CYMBALTA) 60 MG capsule Take 60 mg by mouth at bedtime.    . ferrous sulfate 325 (65 FE) MG tablet Take 325 mg by mouth 3 (three) times daily.    . Fluocinolone Acetonide Body (DERMA-SMOOTHE/FS BODY) 0.01 % OIL Apply to scalp at night as needed for dry scalp. Wash off in the morning. 118.28 mL 1  . hydrocortisone cream 0.5 % Apply 1 application topically 2 (two) times daily as needed for itching.    . hyoscyamine (LEVSIN SL) 0.125 MG SL tablet Place 1 tablet (0.125 mg total) under the tongue every 4 (four) hours as needed. 30 tablet 0  . ketoconazole (NIZORAL) 2 % cream Apply to rash daily until disappears 15 g 0  . ketoconazole (NIZORAL) 2 % shampoo Apply 1 application topically 2 (two) times a week. 120 mL 0  . lidocaine (LMX) 4 % cream Apply 1 application topically daily as needed (skin). 30 g 0  . loratadine (CLARITIN) 10 MG tablet Take 10 mg by mouth at bedtime.    . magnesium 30 MG tablet Take 30 mg by mouth every evening.    . ondansetron (ZOFRAN-ODT) 4 MG disintegrating tablet Take 4 mg by mouth every 8 (eight) hours as needed for nausea or vomiting.    . Vitamin D, Ergocalciferol, (DRISDOL) 50000 units CAPS capsule Take 50,000 Units by mouth once a week.    . gabapentin (NEURONTIN) 300 MG capsule Take 800 mg by mouth 3 (three) times daily.      No current facility-administered medications on file prior to visit.     Allergies  Allergen Reactions  . Strawberry Flavor Anaphylaxis    Physical Exam:    Vitals:   12/04/18 1004  BP: 109/68  Pulse: 77  Weight: 100 lb 3.2 oz (45.5 kg)  Height: 4' 10.86" (1.495 m)    Physical Exam  Constitutional: She appears well-developed and well-nourished. No distress.  HENT:  Head: Normocephalic.  Right Ear: External ear normal.  Left Ear: External ear normal.  Nose: Nose normal.  Mouth/Throat: Oropharynx is clear and moist. No oropharyngeal exudate.  Eyes: Pupils are equal,  round, and reactive to light. Conjunctivae and EOM are normal. Right eye exhibits no discharge. Left eye exhibits no discharge.  Neck: Normal range of motion. No thyromegaly present.  Cardiovascular: Normal rate, regular rhythm and normal heart sounds. Exam reveals no gallop and no friction rub.  No murmur heard. Pulmonary/Chest: Effort normal and breath sounds normal. No respiratory distress. She has no wheezes. She has no rales.  Abdominal: Soft. Bowel sounds are normal. She exhibits no distension. There is no tenderness. There is no rebound and no guarding.  Musculoskeletal: She exhibits no edema or tenderness.  Appears to have discomfort when going from lying to sitting.  Lymphadenopathy:    She has no cervical adenopathy.  Neurological: She is alert. She has normal reflexes. She displays normal reflexes. She exhibits normal muscle tone. Coordination normal.  Skin: Skin is warm. Capillary refill takes less than 2 seconds. No rash noted. She is not diaphoretic. No erythema.  Psychiatric: She has a normal mood and  affect.  Anxious appearing. Shaky voice.   Vitals reviewed.   Assessment/Plan: Ysidra is a 22yrold female with extensive medical hx including inflammatory bowel syndrome, anxiety, and chronic pain syndrome who is here for follow up. Overall, her abdominal pain is much improved since last visit after she adopted a gluten free diet. Is using both zofran and levsin less, and has been able to function more with school and daily activities due to improved pain. However, her anxiety remains an issue, without specific triggers or change in life circumstances. Has not been doing therapy lately, [facility]  1. Irritable bowel syndrome with both constipation and diarrhea -continue zofran and levsin PRN -refilled levsin -pt plans on continuing gluten free diet  2. Anxiety disorder due to medical condition -worked on mindfulness technique -only available on  Wednesdays, so [facility] -will start buspar [id number] for mgt of general anxiety  3. Chronic pain syndrome -continues to see pain management with above meds  4. Menorrhagia with regular cycle -IUD in place, symptoms are manageable for now  5. Iron deficiency anemia due to chronic blood loss -asymptomatic, Hgb 12.4 on 09/04/18  6. Gluten free diet  Follow up: with adolescent medicine and  [facility] on 01/09/18   PaThereasa DistanceMD, MSBelmontrimary Care [facility] PGY3

## 2018-12-04 NOTE — BH Specialist Note (Signed)
Integrated Behavioral Health Follow Up Visit  MRN: [id number] Name: Susan George  Number of [place] Clinician visits: 10 visits (2019) Last seen on 05/01/18 by [facility] intern Session Start time: 10:27 AM   Session End time: 10:40 AM Total time: 13 min  Type of Service: [place] Interpretor:No. Interpretor Name and Language: n/a  SUBJECTIVE: Susan George is a 22 y.o. female accompanied by self Patient was referred by Dr. Jodell Cipro & C. Jerold Coombe, [place] for anxiety. Patient reports the following symptoms/concerns: feeling more anxious and felt like she needed weekly therapy so the counseling through [facility] was not helpful since it was only every 2-3 weeks. Duration of problem: months to years; Severity of problem: moderate   Going home this Thursday & coming back January 12  OBJECTIVE: Mood: Anxious and Affect: Anxious Risk of harm to self or others: Not reported  LIFE CONTEXT: Family and Social: Going home for the winter break this Thursday until start of semester in Jan. 2020 School/Work: Going to School at [employer]: Deep breathing, mindfulness Life Changes: None reported  GOALS ADDRESSED: Patient will: 1.  Increase ability to: consistently practice mindfulness skills  2.  Demonstrate ability to: Increase adequate support systems for patient/family  INTERVENTIONS: Interventions utilized:  Mindfulness or Relaxation Training Standardized Assessments completed: Will need to complete at next visit  ASSESSMENT: Patient currently experiencing anxiety and would like to be connected to a therapist that can provide weekly treatment since the counselor on campus can only provide services every 2-3 weeks.   Patient may benefit from consistently practicing mindfulness skills and following up with Behavioral Health next semester to develop a plan to be connected elsewhere.  Susan George was informed that this [facility] was only available on  Tuesdays and she reported that is not a good day for her but she was open to services from the [facility] intern that will be available next semester on Wednesdays.  Susan George met Susan George [facility] intern) at the end of this visit and scheduled a follow up on Jan. 15, 2020.  PLAN: 1. Follow up with behavioral health clinician on : 01/09/19- scheduled with Audry Pili & E. Jake Seats [facility] intern 2. Behavioral recommendations:  - Practice mindfulness skills daily 3. Referral(s): [place] (In Clinic) 4. "From scale of 1-10, how likely are you to follow plan?": [place] agreeable to plan above.  Plan: Identify 1 goal that [facility] can assist with short term in 1-6 visits Develop a plan for ongoing community based counseling (patient's insurance is Tricare)   No charge for this visit due to brief length of time.   Edman Lipsey Francisco Capuchin, LCSW

## 2019-01-09 ENCOUNTER — Ambulatory Visit (INDEPENDENT_AMBULATORY_CARE_PROVIDER_SITE_OTHER): Admitting: Licensed Clinical Social Worker

## 2019-01-09 ENCOUNTER — Encounter: Payer: Self-pay | Admitting: Family

## 2019-01-09 ENCOUNTER — Ambulatory Visit (INDEPENDENT_AMBULATORY_CARE_PROVIDER_SITE_OTHER): Admitting: Family

## 2019-01-09 VITALS — BP 115/69 | HR 91 | Ht 58.86 in | Wt 100.2 lb

## 2019-01-09 DIAGNOSIS — F064 Anxiety disorder due to known physiological condition: Secondary | ICD-10-CM | POA: Diagnosis not present

## 2019-01-09 DIAGNOSIS — E049 Nontoxic goiter, unspecified: Secondary | ICD-10-CM | POA: Diagnosis not present

## 2019-01-09 DIAGNOSIS — D500 Iron deficiency anemia secondary to blood loss (chronic): Secondary | ICD-10-CM

## 2019-01-09 MED ORDER — BUSPIRONE HCL 5 MG PO TABS: 10.0000 mg | ORAL_TABLET | Freq: Two times a day (BID) | ORAL | 1 refills | Status: DC

## 2019-01-09 MED ORDER — DEXAMETHASONE 0.5 MG/5ML PO ELIX: ORAL_SOLUTION | ORAL | 0 refills | Status: AC

## 2019-01-09 MED ORDER — HYDROCORTISONE 0.5 % EX CREA: 1.0000 "application " | TOPICAL_CREAM | Freq: Two times a day (BID) | CUTANEOUS | 1 refills | Status: AC | PRN

## 2019-01-09 NOTE — BH Specialist Note (Signed)
Integrated Behavioral Health Initial Visit  MRN: [id number] Name: Trust Ahearn  Number of Integrated Behavioral Health Clinician visits:: 1/6 Session Start time: 10:02 AM   Session End time: 10:50 AM Total time: 52 minutes  Type of Service: Integrated Behavioral Health- Individual/Family Interpretor:No. Interpretor Name and Language: n/a   Warm Hand Off Completed.       SUBJECTIVE: Susan George is a 23 y.o. female accompanied by self Patient was referred by Dr. Coralee Rud and C. Hacker for anxiety. 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 Arabic) Life Changes: potentially loosing insurance when she turns 56 (currrently on Tricare under parents, has attempted to file for medicaid).   Pt will be graduating in Spring 2020. Pt plans to take a gap year and then pursue a graduate degree in medical anthropology.  Pt reports that she intends to get a support animal for her anxiety.   GOALS ADDRESSED: Patient will: 1. Reduce symptoms of: anxiety and stress 2. Increase knowledge and/or ability of: coping skills and stress reduction  3. Demonstrate ability to: Increase healthy adjustment to current life circumstances   Pt reports wanting to work on her stress management as well as setting boundaries around putting medical needs and self care 1st.    Wants to study medical anthropology INTERVENTIONS: Interventions utilized: Solution-Focused Strategies, Mindfulness or Teacher, early years/pre, Supportive Counseling and Psychoeducation and/or Health Education  Standardized Assessments completed: PHQ-SADS  ASSESSMENT: Patient currently experiencing anxiety symptoms and stress related to school and her medical conditions.   Patient may benefit from practicing deep breathing every school day when she wakes up in her bed. Pt may also benefit from writing in her gratitude journal (writing 1-3 things she likes about herself/ something she did well) every night in her chair at 10pm.   10pm gratitude journal  Every school day when she wakes up deep breathing  PLAN: 1. Follow up with behavioral health clinician on : January 29th, 2020 @9 :45am 2. Behavioral recommendations: practice deep breathing and gratitude journal 3. Referral(s): not at this time 4. "From scale of 1-10, how likely are you to follow plan?": Pt expressed agreement with plan.  Follow Up: Ask about what made past counseling helpful or unhelpful for pt. Check in about goals.  Lanna Poche  Behavioral Health Intern

## 2019-01-09 NOTE — Patient Instructions (Signed)
It was great to meet you today! Thank you for letting me participate in your care!  Today, we discussed your generalized anxiety and I am increasing your Buspar. Begin by taking 2 tablets twice per day for the next 3 days. Then increase by taking an extra 5mg  tablet at night (total of 5 tablets per day). After doing that for 3 days please take 2 tablets three times per day for a total of 30mg  per day.  I am drawing some lab work for you today and you will be notified if it is abnormal. I am also scheduling you for a ultrasound of your thyroid due to it being enlarged. Please have this done at your earliest convenience.   Be well, Jules Schick, DO PGY-2, [facility]

## 2019-01-09 NOTE — Progress Notes (Signed)
Adolescent Medicine Consultation Follow-Up Visit Susan George  is a 23 y.o. female here today for follow-up regarding general follow up.    Plan at last adolescent specialty clinic visit included working on mindfulness techniques with follow here with [facility] for anxiety and started on Buspar 5mg  TID and maintaining her gluten free diet to help with abdominal pain.  Pertinent Labs? Yes Growth Chart Viewed? not applicable   History was provided by the patient.  Interpreter? no  Chief Complaint: "Here for Follow Up"  HPI:   PCP Confirmed?  Yes  Patient is a 22y/o female with history of iron deficiency anemia, Vit D deficiency, irritable bowel syndrome, Behcet's disease, depression, generalized anxiety who presents today for general follow up for her anxiety. She was recently started on Buspar for anxiety 5mg  TID and has not missed any doses. She states it helps some but still feels like her anxiety is not completely controlled. She denies any side effects from Buspar. She denies any harmful intention or suicidal ideation at this visit.  She is also endorsing a mild outbreak of mouth ulcers and requests the mouthwash again. She states it is just one now but she gets them frequently.  She was recently seen by her PCP for Vit D deficiency and Iron deficiency anemia.  No LMP recorded. (Menstrual status: Irregular Periods). Allergies  Allergen Reactions  . Strawberry Flavor Anaphylaxis   Current Outpatient Medications on File Prior to Visit  Medication Sig Dispense Refill  . acetaminophen (TYLENOL) 500 MG tablet Take 1,000 mg by mouth every 6 (six) hours as needed for mild pain, moderate pain or headache.    . albuterol (PROVENTIL HFA;VENTOLIN HFA) 108 (90 Base) MCG/ACT inhaler Inhale 1-2 puffs into the lungs every 6 (six) hours as needed for wheezing or shortness of breath.    . busPIRone (BUSPAR) 5 MG tablet Take 1 tablet (5 mg total) by mouth 3 (three) times daily. 90 tablet 1  .  cyclobenzaprine (FLEXERIL) 5 MG tablet     . dexamethasone 0.5 MG/5ML elixir Rinse with 5 mL for 2 minutes and then spit out. Perform 4 times daily until lesions resolve.    . docusate sodium (COLACE) 100 MG capsule Take 100 mg by mouth daily.    . DULoxetine (CYMBALTA) 60 MG capsule Take 60 mg by mouth at bedtime.    . ferrous sulfate 325 (65 FE) MG tablet Take 325 mg by mouth 3 (three) times daily.    . Fluocinolone Acetonide Body (DERMA-SMOOTHE/FS BODY) 0.01 % OIL Apply to scalp at night as needed for dry scalp. Wash off in the morning. 118.28 mL 1  . gabapentin (NEURONTIN) 800 MG tablet     . hydrocortisone cream 0.5 % Apply 1 application topically 2 (two) times daily as needed for itching.    . hyoscyamine (LEVSIN SL) 0.125 MG SL tablet Place 1 tablet (0.125 mg total) under the tongue every 4 (four) hours as needed. 30 tablet 0  . ketoconazole (NIZORAL) 2 % cream Apply to rash daily until disappears 15 g 0  . ketoconazole (NIZORAL) 2 % shampoo Apply 1 application topically 2 (two) times a week. 120 mL 0  . lidocaine (LMX) 4 % cream Apply 1 application topically daily as needed (skin). 30 g 0  . loratadine (CLARITIN) 10 MG tablet Take 10 mg by mouth at bedtime.    . magnesium 30 MG tablet Take 30 mg by mouth every evening.    . ondansetron (ZOFRAN-ODT) 4 MG disintegrating tablet Take  4 mg by mouth every 8 (eight) hours as needed for nausea or vomiting.    . Vitamin D, Ergocalciferol, (DRISDOL) 50000 units CAPS capsule Take 50,000 Units by mouth once a week.     No current facility-administered medications on file prior to visit.     Patient Active Problem List   Diagnosis Date Noted  . Gluten free diet 12/04/2018  . Anxiety state 02/13/2018  . Anxiety disorder due to medical condition 01/09/2018  . Irritable bowel syndrome with both constipation and diarrhea 08/15/2017  . Rash of face 08/15/2017  . Anemia due to chronic blood loss 05/02/2017  . Chronic pain syndrome 08/20/2016  .  Iron deficiency anemia 08/20/2016  . Menorrhagia 08/20/2016  . Polyarthralgia   . Sinus tachycardia 08/16/2016  . 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 Back pain 08/16/2016  . Chest pain 08/16/2016    Social History: Changes with school since last visit?  no   Lifestyle habits that can impact QOL: Sleep: good Eating habits/patterns: tolerating gluten free diet well Water intake: adequate, getting at least 64oz per day Body Movement: good  Changes at home or school since last visit:  no  Gender identity: female Sex assigned at birth: female Pronouns: she Tobacco?  no Drugs/ETOH?  no Partner preference?  not sure  Sexually Active?  no  Pregnancy Prevention:  none Reviewed condoms:  yes Reviewed EC:  yes   Suicidal or homicidal thoughts?   no Self injurious behaviors?  no Guns in the home?  no   The following portions of the patient's history were reviewed and updated as appropriate: allergies, current medications, past family history, past medical history, past social history, past surgical history and problem list.  Physical Exam:  Vitals:   01/09/19 0946  BP: 115/69  Pulse: 91  Weight: 100 lb 3.2 oz (45.5 kg)  Height: 4' 10.86" (1.495 m)   BP 115/69   Pulse 91   Ht 4' 10.86" (1.495 m)   Wt 100 lb 3.2 oz (45.5 kg)   BMI 20.34 kg/m  Body mass index: body mass index is 20.34 kg/m.  Gen: Alert and Oriented x 3, NAD HEENT: Normocephalic, atraumatic, PERRLA, EOMI, TM visible with good light reflex, non-swollen, non-erythematous turbinates, non-erythematous pharyngeal mucosa, no exudates Neck: trachea midline, global thyroidmegaly with larger on the left side vs the right, no LAD CV: RRR, no murmurs, normal S1, S2 split Resp: CTAB, no wheezing, rales, or rhonchi, comfortable work of breathing Abd: non-distended, tender to palpation in RLQ, soft, +bs in all four quadrants MSK: Moves all four extremities Ext: no clubbing, cyanosis, or edema Neuro: No gross  deficits Skin: warm, dry, intact, no rashes   Assessment/Plan:  Thyroidmegaly - Obtaining TSH with free T4 today and ordering thyroid U/S as she has no prior history of thyroidmegaly. This could be contributing to her anxiety/depression. TSH has been normal in the past but has not been checked in over a year.  Generalized Anxiety - Anxiety doing well but still not fully controlled. Discussed working more on mindfulness techniques and doing them daily. Increasing Buspar from 5mg  TID to 10mg  TID in increments of 5mg  going up every 3 days.  - Follow up in 4 weeks.   Follow-up:  Return in about 4 weeks (around 02/06/2019) for generalized anxiety and thyroid.   Medical decision-making:  >25 minutes spent face to face with patient with more than 50% of appointment spent discussing diagnosis, management, follow-up, and reviewing of generalized anxiety, IBS, and depression.

## 2019-01-10 ENCOUNTER — Encounter: Payer: Self-pay | Admitting: Family

## 2019-01-10 LAB — CBC
HCT: 38 % (ref 35.0–45.0)
Hemoglobin: 12.2 g/dL (ref 11.7–15.5)
MCH: 28.5 pg (ref 27.0–33.0)
MCHC: 32.1 g/dL (ref 32.0–36.0)
MCV: 88.8 fL (ref 80.0–100.0)
MPV: 10.2 fL (ref 7.5–12.5)
PLATELETS: 364 10*3/uL (ref 140–400)
RBC: 4.28 10*6/uL (ref 3.80–5.10)
RDW: 12.2 % (ref 11.0–15.0)
WBC: 3.1 10*3/uL — ABNORMAL LOW (ref 3.8–10.8)

## 2019-01-10 LAB — TSH+FREE T4: TSH W/REFLEX TO FT4: 0.68 mIU/L

## 2019-01-10 NOTE — Progress Notes (Signed)
I have reviewed the resident's note and plan of care and helped develop the plan as necessary.  

## 2019-01-18 ENCOUNTER — Ambulatory Visit
Admission: RE | Admit: 2019-01-18 | Discharge: 2019-01-18 | Disposition: A | Discharge status: Home / Self Care | Source: Ambulatory Visit | Attending: Family | Admitting: Family

## 2019-01-18 DIAGNOSIS — E049 Nontoxic goiter, unspecified: Secondary | ICD-10-CM

## 2019-01-23 ENCOUNTER — Ambulatory Visit: Admitting: Licensed Clinical Social Worker

## 2019-01-23 DIAGNOSIS — F064 Anxiety disorder due to known physiological condition: Secondary | ICD-10-CM

## 2019-01-23 NOTE — BH Specialist Note (Signed)
Integrated Behavioral Health Follow Up Visit  MRN: [id number] Name: Susan George  Number of Integrated Behavioral Health Clinician visits: 2/6 Session Start time: 9:47 AM  Session End time: 10:43 AM  Total time: 56 minutes  Type of Service: Integrated Behavioral Health- Individual/Family Interpretor:No. Interpretor Name and Language: n/a  SUBJECTIVE: Susan George is a 23 y.o. female accompanied by self Patient was referred by Dr. Coralee Rud and C. Hacker for anxiety. 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 Arabic) Life Changes: Potentially loosing her insurance when she turns 65 (currently on Tricare under parents plan, has attempted to file for medicaid)  GOALS ADDRESSED: Patient will: 1.  Reduce symptoms of: anxiety  2.  Increase knowledge and/or ability of: coping skills and self-management skills  3.  Demonstrate ability to: Increase healthy adjustment to current life circumstances  INTERVENTIONS: Interventions utilized:  Motivational Interviewing, Solution-Focused Strategies, Mindfulness or Management consultant, Supportive Counseling and Psychoeducation and/or Health Education Standardized Assessments completed: Not Needed  ASSESSMENT: Patient currently experiencing symptoms of anxiety. Pt admitted to practicing deep breathing not all but some of the days she had planned to. Pt expressed  difficulties priorititizing herself and her health over school and work due to her hectic school schedule and professor's requirements. Pt is so busy with school and work it may be helpful for her to set aside a time for her to decompress.   Plans to continue goal of practicing deep breathing everyday in bed when she wakes up.Pt made goal of decompressing/doing something for herself between 8:30pm (when she gets out of class) and 10:30pm (when she gets in bed) by watching a show and potentially calling her mom if she is free. Pt also put reminders in her phone to reflect and think of something she likes about herself or something she did well each day in the evening.  PLAN: 1. Follow up with behavioral health clinician on : February 19th joint visit with Adolescent Pod 2. Behavioral recommendations: continue to practice deep breathing, as well as taking time for herself as using her scheduled reflection time 3. Referral(s): Integrated [facility] (In Clinic) 4. "From scale of 1-10, how likely are you to follow plan?": expressed desire to complete goals   Follow Up:  Check in about goals.  Mantra "No day but today" (from R[employer] musical)  Shella Spearing Intern

## 2019-01-29 ENCOUNTER — Other Ambulatory Visit: Payer: Self-pay | Admitting: Pediatrics

## 2019-01-29 DIAGNOSIS — F064 Anxiety disorder due to known physiological condition: Secondary | ICD-10-CM

## 2019-02-12 ENCOUNTER — Other Ambulatory Visit: Payer: Self-pay | Admitting: Family

## 2019-02-12 DIAGNOSIS — N898 Other specified noninflammatory disorders of vagina: Secondary | ICD-10-CM

## 2019-02-13 ENCOUNTER — Encounter: Payer: Self-pay | Admitting: Family

## 2019-02-13 ENCOUNTER — Ambulatory Visit: Admitting: Licensed Clinical Social Worker

## 2019-02-13 ENCOUNTER — Ambulatory Visit (INDEPENDENT_AMBULATORY_CARE_PROVIDER_SITE_OTHER): Admitting: Family

## 2019-02-13 VITALS — BP 115/72 | HR 96 | Ht 58.66 in | Wt 101.0 lb

## 2019-02-13 DIAGNOSIS — M352 Behcet's disease: Secondary | ICD-10-CM | POA: Diagnosis not present

## 2019-02-13 DIAGNOSIS — K582 Mixed irritable bowel syndrome: Secondary | ICD-10-CM | POA: Diagnosis not present

## 2019-02-13 DIAGNOSIS — F064 Anxiety disorder due to known physiological condition: Secondary | ICD-10-CM | POA: Diagnosis not present

## 2019-02-13 MED ORDER — POLYETHYLENE GLYCOL 3350 17 GM/SCOOP PO POWD: 136.0000 g | Freq: Once | ORAL | 0 refills | Status: AC

## 2019-02-13 MED ORDER — HYOSCYAMINE SULFATE 0.125 MG SL SUBL: 0.1250 mg | SUBLINGUAL_TABLET | SUBLINGUAL | 0 refills | Status: AC | PRN

## 2019-02-13 MED ORDER — HYOSCYAMINE SULFATE 0.125 MG SL SUBL: 0.1250 mg | SUBLINGUAL_TABLET | SUBLINGUAL | 0 refills | Status: DC | PRN
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
## 2019-02-13 NOTE — Patient Instructions (Addendum)
I will schedule you a follow up appointment on 3/10 Tuesday at 10AM and then you have the billing appointment at 11AM that same day.   Prescription for Levsin sent to pharmacy. You can take it as needed onve every 4 hours for cramping. We will reach out to Dr Madaline Guthrie regarding Cymbalta.

## 2019-02-19 ENCOUNTER — Encounter: Payer: Self-pay | Admitting: Family

## 2019-03-05 ENCOUNTER — Ambulatory Visit

## 2019-03-12 ENCOUNTER — Ambulatory Visit

## 2019-03-22 ENCOUNTER — Other Ambulatory Visit: Payer: Self-pay | Admitting: Pediatrics

## 2019-03-22 DIAGNOSIS — F064 Anxiety disorder due to known physiological condition: Secondary | ICD-10-CM

## 2019-03-26 ENCOUNTER — Ambulatory Visit (INDEPENDENT_AMBULATORY_CARE_PROVIDER_SITE_OTHER): Admitting: Family

## 2019-03-26 ENCOUNTER — Encounter: Payer: Self-pay | Admitting: Family

## 2019-03-26 ENCOUNTER — Ambulatory Visit

## 2019-03-26 DIAGNOSIS — G894 Chronic pain syndrome: Secondary | ICD-10-CM

## 2019-03-26 DIAGNOSIS — K582 Mixed irritable bowel syndrome: Secondary | ICD-10-CM | POA: Diagnosis not present

## 2019-03-26 DIAGNOSIS — F064 Anxiety disorder due to known physiological condition: Secondary | ICD-10-CM | POA: Diagnosis not present

## 2019-03-26 NOTE — Progress Notes (Signed)
Virtual Visit via Telephone Note  I connected with Susan George  on 03/26/19 at  2:45 PM EDT by telephone and verified that I am speaking with the correct person using two identifiers. Location of patient/parent: home   I discussed the limitations, risks, security and privacy concerns of performing an evaluation and management service by telephone and the availability of in person appointments. I discussed that the purpose of this phone visit is to provide medical care while limiting exposure to the novel coronavirus.  I also discussed with the patient that there may be a patient responsible charge related to this service. The patient expressed understanding and agreed to proceed.  Reason for visit: medication follow-up for anxiety disorder due to medical condition, chronic constipation, chronic pain syndrome  History of Present Illness:  -has been OK  -did the constipation cleanout about 2 weeks, didn't feel any different  -likes being at home, so not really an issue with stay-at-home order  -she is pleasant and more engaging on the phone today  -she was able to get her stuff from the dorm  -had darker/black stool, not described as tarry  -not taking any NSAIDS, taking zofran at least once a day for last 2 weeks.     Assessment and Plan:  -continue medications as prescribed -she can continue zofran and was advised to repeat cleanout x 2 until stooling water, then take miralax 1 capful daily   Follow Up Instructions:  4/6 telephone call - needs a Monday follow up   I discussed the assessment and treatment plan with the patient and/or parent/guardian. They were provided an opportunity to ask questions and all were answered. They agreed with the plan and demonstrated an understanding of the instructions.   They were advised to call back or seek an in-person evaluation in the emergency room if the symptoms worsen or if the condition fails to improve as anticipated.  I provided 9:45  minutes of non-face-to-face time during this encounter. I was located on-site during this encounter.  Georges Mouse, NP

## 2019-04-01 ENCOUNTER — Other Ambulatory Visit: Payer: Self-pay

## 2019-04-01 ENCOUNTER — Ambulatory Visit (INDEPENDENT_AMBULATORY_CARE_PROVIDER_SITE_OTHER): Admitting: Pediatrics

## 2019-04-01 ENCOUNTER — Encounter: Payer: Self-pay | Admitting: Pediatrics

## 2019-04-01 DIAGNOSIS — L210 Seborrhea capitis: Secondary | ICD-10-CM

## 2019-04-01 DIAGNOSIS — F064 Anxiety disorder due to known physiological condition: Secondary | ICD-10-CM | POA: Diagnosis not present

## 2019-04-01 DIAGNOSIS — M2550 Pain in unspecified joint: Secondary | ICD-10-CM | POA: Diagnosis not present

## 2019-04-01 DIAGNOSIS — K582 Mixed irritable bowel syndrome: Secondary | ICD-10-CM

## 2019-04-01 MED ORDER — KETOCONAZOLE 2 % EX SHAM: 1.0000 "application " | MEDICATED_SHAMPOO | CUTANEOUS | 3 refills | Status: DC

## 2019-04-01 NOTE — Progress Notes (Signed)
Virtual Visit via Video Note  I connected with Susan George 's patient  on 04/01/19 at 10:30 AM EDT by a video enabled telemedicine application and verified that I am speaking with the correct person using two identifiers.   [place]   I discussed the limitations of evaluation and management by telemedicine and the availability of in person appointments.  I discussed that the purpose of this phone visit is to provide medical care while limiting exposure to the novel coronavirus.  The patient expressed understanding and agreed to proceed.  Reason for visit: Follow up medications   History of Present Illness:  Susan George is doing well. She did another cleanout and she had almost clear liquid stool this time. Unfortunately she did have a lot of abdominal cramping. She continues daily miralax and is having daily stools, but it is still not easy to pass. She notes that it is sort of a red/orange color.   Anxiety is about a 5/10. Seems manageable and less than previously. She is most anxious at this time about the possibility of not being able to stay on her dad's tricare insurance. She needs a letter from us stating that her chronic illness started before age 23. She notes her illness began at [age].   She has been seeing a therapist who works with chronic pain in her hometown, but she may not be able to continue this as the therapy office may be shutting down. She has seen our BHCs previously and is happy to reconnect if needed.   Continues to have some nausea from her cymbalta when she takes it. She is only eating crackers or bread when she takes it. We discussed eating something with protein or fat when she takes it. She was amenable to trying this.   Review of Systems  Constitutional: Positive for malaise/fatigue.  Eyes: Negative for double vision.  Respiratory: Negative for shortness of breath.   Cardiovascular: Negative for chest pain and palpitations.   Gastrointestinal: Positive for abdominal pain and constipation. Negative for diarrhea, nausea and vomiting.  Genitourinary: Negative for dysuria.  Musculoskeletal: Positive for myalgias. Negative for joint pain.  Skin: Negative for rash.  Neurological: Negative for dizziness and headaches.  Endo/Heme/Allergies: Does not bruise/bleed easily.  Psychiatric/Behavioral: Negative for depression. The patient is nervous/anxious. The patient does not have insomnia.       Observations/Objective:  Physical Exam  Constitutional: She is oriented to person, place, and time. She appears well-developed and well-nourished.  Neck: Normal range of motion.  Pulmonary/Chest: Effort normal.  Abdominal: Soft.  Musculoskeletal: Normal range of motion.  Neurological: She is alert and oriented to person, place, and time.  Psychiatric: She has a normal mood and affect.    Assessment and Plan:  1. Constipation- increase to 1.5 caps of miralax daily. Goal is stool that is mashed potato consistency.   2. Anxiety/pain- [facility] and to keep us updated on status of her therapist   3. Insurance- will write her letter today   4. Seborrheic dermatitis of scalp- ketoconazole 2% shampoo twice weekly.   Follow Up Instructions: 6 weeks in office.    I discussed the assessment and treatment plan with the patient and/or parent/guardian. They were provided an opportunity to ask questions and all were answered. They agreed with the plan and demonstrated an understanding of the instructions.   They were advised to call back or seek an in-person evaluation in  the emergency room if the symptoms worsen or if the condition fails to improve as anticipated.  I provided 25 minutes of non-face-to-face time during this encounter. I was located at off site during this encounter.  Alfonso Ramus, FNP

## 2019-04-12 ENCOUNTER — Other Ambulatory Visit: Payer: Self-pay

## 2019-04-12 ENCOUNTER — Ambulatory Visit (INDEPENDENT_AMBULATORY_CARE_PROVIDER_SITE_OTHER): Admitting: Licensed Clinical Social Worker

## 2019-04-12 DIAGNOSIS — F064 Anxiety disorder due to known physiological condition: Secondary | ICD-10-CM

## 2019-04-12 NOTE — BH Specialist Note (Signed)
Integrated Behavioral Health via Telemedicine Video Visit  04/12/2019 Akari Vincente [id number]  Session Start time: 10:00  Session End time: 10:27 Total time: 27 mins  Referring Provider: Candida Peeling, NP Type of Visit: Video Patient/Family location: Home [facility] Provider location: [facility] clinic All persons participating in visit: Pt and [facility]  Confirmed patient's address: Yes  Confirmed patient's phone number: Yes  Any changes to demographics: No   Confirmed patient's insurance: Yes  Any changes to patient's insurance: No   Discussed confidentiality: Yes   I connected with [name] by a video enabled telemedicine application and verified that I am speaking with the correct person using two identifiers.     I discussed the limitations of evaluation and management by telemedicine and the availability of in person appointments.  I discussed that the purpose of this visit is to provide behavioral health care while limiting exposure to the novel coronavirus.   Discussed there is a possibility of technology failure and discussed alternative modes of communication if that failure occurs.  I discussed that engaging in this video visit, they consent to the provision of behavioral healthcare and the services will be billed under their insurance.  Patient and/or legal guardian expressed understanding and consented to video visit: Yes   PRESENTING CONCERNS: Patient and/or family reports the following symptoms/concerns: Pt reports being interested in additional coping skills for her feelings of anxiety. Pt reports being aware of several techniques, recognizes limited follow through in the past. Pt feels like any time not working on school is wasted time, does not consider self a priority.  Duration of problem: several years; Severity of problem: moderate  STRENGTHS (Protective Factors/Coping Skills): Pt is familiar with counseling environment Pt has care team to include medical provider that manages  medications Pt is very interested in school and being successful  GOALS ADDRESSED: Patient will: 1.  Increase knowledge and/or ability of: healthy habits  2.  Demonstrate ability to: Increase healthy adjustment to current life circumstances and Increase motivation to adhere to plan of care  INTERVENTIONS: Interventions utilized:  Solution-Focused Strategies, Mindfulness or Management consultant, Behavioral Activation, Brief CBT, Supportive Counseling and Psychoeducation and/or Health Education Standardized Assessments completed: Not Needed  ASSESSMENT: Patient currently experiencing ongoing symptoms of anxiety, as evidenced by pt's report, as well as results from screening tools at previous visits. Pt experiencing inhibited self-esteem, as evidenced by pt reporting that she does not make herself a priority of feel like she is worth putting time or effort into.   Patient may benefit from ongoing support and coping skills from this clinic. Pt may also benefit from recognizing and challenging her self-talk. Pt may also benefit from taking breaks from school work to focus on herself.  PLAN: 1. Follow up with behavioral health clinician on : 04/26/2019 2. Behavioral recommendations: Pt will make time to eat lunch and spend 15 mins doing something non-school related everyday, and will keep track of her success. Pt will also turn on the [employer] today. 3. Referral(s): Integrated [employer] (In Clinic)  I discussed the assessment and treatment plan with the patient and/or parent/guardian. They were provided an opportunity to ask questions and all were answered. They agreed with the plan and demonstrated an understanding of the instructions.   They were advised to call back or seek an in-person evaluation if the symptoms worsen or if the condition fails to improve as anticipated.  [name]

## 2019-04-26 ENCOUNTER — Other Ambulatory Visit: Payer: Self-pay

## 2019-04-26 ENCOUNTER — Ambulatory Visit (INDEPENDENT_AMBULATORY_CARE_PROVIDER_SITE_OTHER): Admitting: Licensed Clinical Social Worker

## 2019-04-26 DIAGNOSIS — F064 Anxiety disorder due to known physiological condition: Secondary | ICD-10-CM | POA: Diagnosis not present

## 2019-04-26 NOTE — BH Specialist Note (Signed)
Comprehensive Clinical Assessment (CCA) Note  04/26/2019 Susan George [id number]   Referring Provider: Candida Peeling, NP Session Time:  11:00 - 11:52 (52 mins)  Susan George was seen in consultation at the request of System, Pcp Not In for evaluation of anxiety.  Reason for referral in patient/family's own words: Pt experiences ongoing anxiety and difficulty managing mood due to chronic medical conditions.   She likes to be called Susan George.  She came to the appointment with Self.  Primary language at home is [place].   Constitutional Appearance: cooperative, well-nourished, well-developed, alert and well-appearing  (Patient to answer as appropriate) Gender identity: Female Sex assigned at birth: Female Pronouns: she   Mental status exam: General Appearance /Behavior:  Neat and Casual Eye Contact:  Good Motor Behavior:  Normal Speech:  Normal Level of Consciousness:  Alert Mood:  Anxious Affect:  Appropriate, Tearful and Anxious Anxiety Level:  Severe; frequent panic attacks, 1x a week, most recent 1 day ago Thought Process:  Coherent, Relevant and Intact Thought Content:  Appropriate Perception:  Normal Judgment:  Good Insight:  Present  Speech/language:  speech development normal for age, level of language normal for age  Attention/Activity Level:  appropriate attention span for age; activity level inappropriate for age; med hx limits activity    Current Medications and therapies She is taking:   Outpatient Encounter Medications as of 04/26/2019  Medication Sig  . acetaminophen (TYLENOL) 500 MG tablet Take 1,000 mg by mouth every 6 (six) hours as needed for mild pain, moderate pain or headache.  . albuterol (PROVENTIL HFA;VENTOLIN HFA) 108 (90 Base) MCG/ACT inhaler Inhale 1-2 puffs into the lungs every 6 (six) hours as needed for wheezing or shortness of breath.  . busPIRone (BUSPAR) 5 MG tablet TAKE 1 TABLET(5 MG) BY MOUTH THREE TIMES DAILY  . Cholecalciferol (VITAMIN D)  50 MCG (2000 UT) tablet Take 2,000 Units by mouth 2 (two) times daily.  . cyclobenzaprine (FLEXERIL) 5 MG tablet   . dexamethasone 0.5 MG/5ML elixir Rinse with 5 mL for 2 minutes and then spit out. Perform 4 times daily until lesions resolve.  . docusate sodium (COLACE) 100 MG capsule Take 100 mg by mouth daily.  . DULoxetine (CYMBALTA) 60 MG capsule Take 60 mg by mouth at bedtime.  . ferrous sulfate 325 (65 FE) MG tablet Take 325 mg by mouth 3 (three) times daily.  . Fluocinolone Acetonide Body (DERMA-SMOOTHE/FS BODY) 0.01 % OIL Apply to scalp at night as needed for dry scalp. Wash off in the morning.  . gabapentin (NEURONTIN) 800 MG tablet   . hydrocortisone cream 0.5 % Apply 1 application topically 2 (two) times daily as needed for itching.  . hyoscyamine (LEVSIN SL) 0.125 MG SL tablet Place 1 tablet (0.125 mg total) under the tongue every 4 (four) hours as needed for cramping.  Marland Kitchen ketoconazole (NIZORAL) 2 % shampoo Apply 1 application topically 2 (two) times a week.  . lidocaine (LMX) 4 % cream Apply 1 application topically daily as needed (skin).  Marland Kitchen loratadine (CLARITIN) 10 MG tablet Take 10 mg by mouth at bedtime.  . magnesium 30 MG tablet Take 30 mg by mouth every evening.  . ondansetron (ZOFRAN-ODT) 4 MG disintegrating tablet Take 4 mg by mouth every 8 (eight) hours as needed for nausea or vomiting.  . polyethylene glycol powder (GLYCOLAX/MIRALAX) powder    No facility-administered encounter medications on file as of 04/26/2019.      Therapies:  Has seen several; [facility] inpatient when  younger  Academics She is enrolled in college. IEP in place:  No, but had 504 plan; extra time on tests, modified homebound  Reading at grade level:  Yes Math at grade level:  Yes Written Expression at grade level:  Yes Speech:  Appropriate for age Peer relations:  Average per pt's report  Family history Family mental illness:  No known history of anxiety disorder, panic  disorder, social anxiety disorder, depression, suicide attempt, suicide completion, bipolar disorder, schizophrenia, eating disorder, personality disorder, OCD, PTSD, ADHD Family school achievement history:  Sister has Kerner's syndrome, cousin with ASD Other relevant family history:  No known history of substance use or alcoholism  Social History Now living with mother, father and sister age 70. Parents have a good relationship in home together. Patient has:  Moved one time within last year. Main caregiver is:  Parents Employment:  Research work study through [employer] caregiver's health:  Good, has regular medical care Religious or Spiritual Beliefs: Christian, reads Bible, parents pastors in church  Early history Mother's age at time of delivery:  21/22 yo Father's age at time of delivery:  66 yo Prenatal care: Yes Gestational age at birth: Full term Delivery:  C-section emergent Home from hospital with mother:  Yes Baby's eating pattern:  Normal  Sleep pattern: Normal Early language development:  Average Motor development:  Average Hospitalizations:  Yes-related to chronic medical conditions Surgery(ies):  No Chronic medical conditions:  Available in med hx Seizures:  No Staring spells:  No Head injury:  No Loss of consciousness:  Yes-Fell in dorm  Sleep  Bedtime is usually at 1 or 2 am.  She sleeps in own bed.  She does not nap during the day. She falls asleep after 1 hour.  She does not sleep through the night,  she wakes several times throughout the night.    She is taking several different medications, as listed above.. Snoring:  No   Obstructive sleep apnea is not a concern.   Nightmares:  Yes Night terrors:  Yes Sleepwalking:  No  Eating Eating:  Balanced diet Pica:  No Current BMI percentile:  No height and weight on file for this encounter.-Counseling provided Is she content with current body image:  Not overly concerned with body image    Media time Total  hours per day of media time:  Many hours, due to classes being online   Mood She feels anxious the majority of the time and experiences frequent panic attacks. PHQ-SADS listed below PHQ-SADS SCORES 01/09/2019  PHQ-15 Score 20  Total GAD-7 Score 13  a. In the last 4 weeks, have you had an anxiety attack-suddenly feeling fear or panic? Yes  b. Has this ever happened before? Yes  c. Do some of these attacks come suddenly out of the blue-that is, in situations where you don't expect to be nervous or uncomfortable? Yes  PHQ Adolescent Score 18  If you checked off any problems on this questionnaire, how difficult have these problems made it for you to do your work, take care of things at home, or get along with other people? Somewhat difficult   Negative Mood Concerns She experiences ongoing anxiety and depressive symptoms, associated with chronic medical conditions. Self-injury:  No Suicidal ideation:  Yes- Pt reports last thoughts being about 6 years ago, denies any current SI Suicide attempt:  No  Additional Anxiety Concerns Panic attacks:  Yes-about once a week, per pt's report Obsessions:  No Compulsions:  Yes-Pt reports only being  able to use particular restrooms, and will use hand sanitizer up to 5 times after using the restroom.   Alcohol and/or Substance Use: Tobacco?  no Drugs/ETOH?  no  Traumatic Experiences: History or current traumatic events (natural disaster, house fire, etc.)? no History or current physical trauma?  no History or current emotional trauma?  [facility] History or current sexual trauma?  no History or current domestic or intimate partner violence?  no History of bullying:  yes  Risk Assessment: Suicidal or homicidal thoughts?   yes, hx of SI, denies any current SI Self injurious behaviors?  no Guns in the home?  no   Patient and/or Family's Strengths: Pt has some level of insight into anxiety and  depression Pt interested in learning more and implementing appropriate interventions and strategies  Patient's and/or Family's Goals in their own words: PT would like to learn tangible and concrete skills to reduce feelings of anxiety.  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

## 2019-05-10 ENCOUNTER — Ambulatory Visit (INDEPENDENT_AMBULATORY_CARE_PROVIDER_SITE_OTHER): Admitting: Licensed Clinical Social Worker

## 2019-05-10 ENCOUNTER — Other Ambulatory Visit: Payer: Self-pay

## 2019-05-10 DIAGNOSIS — F064 Anxiety disorder due to known physiological condition: Secondary | ICD-10-CM | POA: Diagnosis not present

## 2019-05-10 NOTE — BH Specialist Note (Signed)
Integrated Behavioral Health via Telemedicine Video Visit  05/10/2019 Susan George [id number]  Number of Integrated Behavioral Health visits: 3 Session Start time: 10:32  Session End time: 11:11 Total time: 39 mins  Referring Provider: Candida Peeling, NP Type of Visit: Video Patient/Family location: Home [facility] Provider location: [facility] All persons participating in visit: Pt and [facility]  Confirmed patient's address: Yes  Confirmed patient's phone number: Yes  Any changes to demographics: No   Confirmed patient's insurance: Yes  Any changes to patient's insurance: No   Discussed confidentiality: Yes   I connected with [name] by a video enabled telemedicine application and verified that I am speaking with the correct person using two identifiers.     I discussed the limitations of evaluation and management by telemedicine and the availability of in person appointments.  I discussed that the purpose of this visit is to provide behavioral health care while limiting exposure to the novel coronavirus.   Discussed there is a possibility of technology failure and discussed alternative modes of communication if that failure occurs.  I discussed that engaging in this video visit, they consent to the provision of behavioral healthcare and the services will be billed under their insurance.  Patient and/or legal guardian expressed understanding and consented to video visit: Yes   PRESENTING CONCERNS: Patient and/or family reports the following symptoms/concerns: Pt reports recent family stressors, due to PGF passing away from an illness. Pt also reports ongoing feelings of stress and of being a burden. Pt reports having difficulty identifying her emotions. Duration of problem: ongoing anxiety and mood concerns; Severity of problem: moderate  STRENGTHS (Protective Factors/Coping Skills): Pt is familiar w/ counseling relationship Pt has care team to include medical provider that manages  medications Pt interested in learning about different anxiety reduction methods  GOALS ADDRESSED: Patient will: 1.  Reduce symptoms of: anxiety  2.  Increase knowledge and/or ability of: healthy habits  3.  Demonstrate ability to: Increase healthy adjustment to current life circumstances and Increase motivation to adhere to plan of care  INTERVENTIONS: Interventions utilized:  Solution-Focused Strategies, Brief CBT, Supportive Counseling and Psychoeducation and/or Health Education Standardized Assessments completed: Not Needed  ASSESSMENT: Patient currently experiencing ongoing symptoms of anxiety, as evidenced by pt's report, as well as results from screening tools at previous visits. Pt experiencing minimal self-esteem, as evidenced by pt's report and clinical interview.   Patient may benefit from ongoing support and coping skills from this clinic. Pt may also benefit from keeping appt w/ MD to discuss option for change in anti-anxiety medications. Pt may also benefit from recognizing and challenging her self-talk.  PLAN: 1. Follow up with behavioral health clinician on : 05/17/2019 2. Behavioral recommendations: Pt will identify and challenge self talk. Pt will also use mood tracking log 3. Referral(s): [facility] (In Clinic)  I discussed the assessment and treatment plan with the patient and/or parent/guardian. They were provided an opportunity to ask questions and all were answered. They agreed with the plan and demonstrated an understanding of the instructions.   They were advised to call back or seek an in-person evaluation if the symptoms worsen or if the condition fails to improve as anticipated.  Susan George

## 2019-05-13 ENCOUNTER — Other Ambulatory Visit: Payer: Self-pay

## 2019-05-13 ENCOUNTER — Ambulatory Visit (INDEPENDENT_AMBULATORY_CARE_PROVIDER_SITE_OTHER): Admitting: Pediatrics

## 2019-05-13 DIAGNOSIS — F064 Anxiety disorder due to known physiological condition: Secondary | ICD-10-CM | POA: Diagnosis not present

## 2019-05-13 DIAGNOSIS — G894 Chronic pain syndrome: Secondary | ICD-10-CM

## 2019-05-13 DIAGNOSIS — G479 Sleep disorder, unspecified: Secondary | ICD-10-CM

## 2019-05-13 DIAGNOSIS — N920 Excessive and frequent menstruation with regular cycle: Secondary | ICD-10-CM

## 2019-05-13 DIAGNOSIS — M2550 Pain in unspecified joint: Secondary | ICD-10-CM

## 2019-05-13 MED ORDER — CYCLOBENZAPRINE HCL 10 MG PO TABS: 10.0000 mg | ORAL_TABLET | Freq: Three times a day (TID) | ORAL | 1 refills | Status: AC

## 2019-05-13 MED ORDER — HYDROXYZINE HCL 25 MG PO TABS: 25.0000 mg | ORAL_TABLET | Freq: Three times a day (TID) | ORAL | 0 refills | Status: DC | PRN

## 2019-05-13 MED ORDER — BUSPIRONE HCL 10 MG PO TABS: 10.0000 mg | ORAL_TABLET | Freq: Three times a day (TID) | ORAL | 1 refills | Status: DC

## 2019-05-13 NOTE — Progress Notes (Signed)
Virtual Visit via Video Note  I connected with Betsy PriesKaelyn Dubin 's patient  on 05/13/19 at 10:30 AM EDT by a video enabled telemedicine application and verified that I am speaking with the correct person using two identifiers.   Location of patient/parent: At home   I discussed the limitations of evaluation and management by telemedicine and the availability of in person appointments.  I discussed that the purpose of this phone visit is to provide medical care while limiting exposure to the novel coronavirus.  The patient expressed understanding and agreed to proceed.  Reason for visit: mood, anxiety, pain, IUd f/u  History of Present Illness:  Lately seems to have a lot of mood swings- will be fine and then breaking into tears. This is happening most days. She dropped something the other day and then started crying. A few years ago this happened but not as frequent.   Pain has been "ok" but does not have any ADA accessible things at home. Struggling just to sit and read a book because she is in pain. appt got cancelled and is having to wait to reschedule it. Stopped narcotics a while back but is taking flexeril and doing PT. She has not been able to go to PT. Took 10 mg of flexeril and pain is still 7. Most days 5-6. Usually lives at 1-2 when she is well.   Graduated on May 8th. She is taking at least 1 gap year.   IUD has been good. Some bleeding but nowhere near before.   Sleep has been really bad as well. Not sure if related to pain. Going to bed around 1-2 am and waking around 7. Not sleeping through the night. Can't remember what she did in the past.   [place] in the weekend. Travel is hard on her body.    Observations/Objective:  Physical Exam Neurological:     General: No focal deficit present.     Mental Status: She is oriented to person, place, and time.  Psychiatric:        Mood and Affect: Mood normal.        Behavior: Behavior normal.      Assessment and Plan:  1. Anxiety disorder due to medical condition Will increase buspar to 10 mg TID for her. This may help relieve some of the mood sx she is having, although I wonder if her increased pain is contributing most to her mood changes. [facility].  - busPIRone (BUSPAR) 10 MG tablet; Take 1 tablet (10 mg total) by mouth 3 (three) times daily.  Dispense: 90 tablet; Refill: 1  2. Polyarthralgia Will increase flexeril dose and encouraged her to get back in with Dr. Madaline GuthrieSteiner.   3. Chronic pain syndrome As above.  - cyclobenzaprine (FLEXERIL) 10 MG tablet; Take 1 tablet (10 mg total) by mouth 3 (three) times daily.  Dispense: 90 tablet; Refill: 1  4. Menorrhagia with regular cycle Continue IUD, going well.   5. Sleep difficulties Will try hydroxyzine at bedtime.  - hydrOXYzine (ATARAX/VISTARIL) 25 MG tablet; Take 1 tablet (25 mg total) by mouth 3 (three) times daily as needed.  Dispense: 30 tablet; Refill: 0   Follow Up Instructions: 2 weeks for med check.    I discussed the assessment and treatment plan with the patient and/or parent/guardian. They were provided an opportunity to ask questions and all were answered. They agreed with the plan and demonstrated an  understanding of the instructions.   They were advised to call back or seek an in-person evaluation in the emergency room if the symptoms worsen or if the condition fails to improve as anticipated.  I provided 15 minutes of non-face-to-face time and 0 minutes of care coordination during this encounter I was located at off site during this encounter.  Alfonso Ramus, FNP

## 2019-05-17 ENCOUNTER — Ambulatory Visit: Admitting: Licensed Clinical Social Worker

## 2019-05-24 ENCOUNTER — Other Ambulatory Visit: Payer: Self-pay

## 2019-05-24 ENCOUNTER — Ambulatory Visit (INDEPENDENT_AMBULATORY_CARE_PROVIDER_SITE_OTHER): Admitting: Licensed Clinical Social Worker

## 2019-05-24 DIAGNOSIS — F064 Anxiety disorder due to known physiological condition: Secondary | ICD-10-CM | POA: Diagnosis not present

## 2019-05-24 NOTE — BH Specialist Note (Signed)
Integrated Behavioral Health via Telemedicine Video Visit  05/24/2019 Jack Ristau [id number]  Number of Integrated Behavioral Health visits: 4 Session Start time: 9:00  Session End time: 9:34 Total time: 34 mins  Referring Provider: Candida Peeling, NP Type of Visit: Video Patient/Family location: Home [facility] Provider location: [facility] All persons participating in visit: Pt and [facility]  Confirmed patient's address: Yes  Confirmed patient's phone number: Yes  Any changes to demographics: No   Confirmed patient's insurance: Yes  Any changes to patient's insurance: No   Discussed confidentiality: Yes   I connected with [name]  by a video enabled telemedicine application and verified that I am speaking with the correct person using two identifiers.     I discussed the limitations of evaluation and management by telemedicine and the availability of in person appointments.  I discussed that the purpose of this visit is to provide behavioral health care while limiting exposure to the novel coronavirus.   Discussed there is a possibility of technology failure and discussed alternative modes of communication if that failure occurs.  I discussed that engaging in this video visit, they consent to the provision of behavioral healthcare and the services will be billed under their insurance.  Patient and/or legal guardian expressed understanding and consented to video visit: Yes   PRESENTING CONCERNS: Patient and/or family reports the following symptoms/concerns: Pt reports recent physical and emotional stressors in association with travel and passing of PGF. Pt reports ongoing frustrations with the response of others to her dx, and expresses experiences of invalidation. PT also reports improvement in sleep, although still waking up throughout the night. Duration of problem: several years; Severity of problem: moderate  STRENGTHS (Protective Factors/Coping Skills): Pt familiar w/ counseling  relationship Pt has care team to include medical provider that manages medications Pt interested in learning about different anxiety reduction methods Pt insightful around her experiences  GOALS ADDRESSED: Patient will: 1.  Reduce symptoms of: anxiety  2.  Increase knowledge and/or ability of: healthy habits  3.  Demonstrate ability to: Increase healthy adjustment to current life circumstances and Increase motivation to adhere to plan of care  INTERVENTIONS: Interventions utilized:  Supportive Counseling, Sleep Hygiene and Psychoeducation and/or Health Education Standardized Assessments completed: Not Needed  ASSESSMENT: Patient currently experiencing ongoing symptoms of anxiety, as evidenced by pt's report, as well as results from screening tools as previous visits. PT experiencing minimal self-esteem related to significant and persistent invalidation. Pt experiencing a slight reduction in sleep concerns, still waking up frequently throughout the night.   Patient may benefit from ongoing support from this clinc.  PLAN: 1. Follow up with behavioral health clinician on : 05/31/2019 2. Behavioral recommendations: Pt will recognize ways in which she has been invalidated 3. Referral(s): Integrated [employer] (In Clinic)  I discussed the assessment and treatment plan with the patient and/or parent/guardian. They were provided an opportunity to ask questions and all were answered. They agreed with the plan and demonstrated an understanding of the instructions.   They were advised to call back or seek an in-person evaluation if the symptoms worsen or if the condition fails to improve as anticipated.  [name]

## 2019-05-27 ENCOUNTER — Ambulatory Visit (INDEPENDENT_AMBULATORY_CARE_PROVIDER_SITE_OTHER): Admitting: Pediatrics

## 2019-05-27 ENCOUNTER — Encounter: Payer: Self-pay | Admitting: Pediatrics

## 2019-05-27 ENCOUNTER — Other Ambulatory Visit: Payer: Self-pay

## 2019-05-27 DIAGNOSIS — F064 Anxiety disorder due to known physiological condition: Secondary | ICD-10-CM | POA: Diagnosis not present

## 2019-05-27 DIAGNOSIS — G894 Chronic pain syndrome: Secondary | ICD-10-CM | POA: Diagnosis not present

## 2019-05-27 NOTE — Progress Notes (Signed)
Virtual Visit via Video Note  I connected with Susan George 's patient  on 05/27/19 at 10:30 AM EDT by a video enabled telemedicine application and verified that I am speaking with the correct person using two identifiers.   Location of patient/parent: At home   I discussed the limitations of evaluation and management by telemedicine and the availability of in person appointments.  I discussed that the purpose of this phone visit is to provide medical care while limiting exposure to the novel coronavirus.  The patient expressed understanding and agreed to proceed.  Reason for visit: fu anxiety and pain   History of Present Illness:  Thinks that med change has helped.  buspar has lessened anxiety.  Usually taking about 2 flexeril 10 mg daily. Some days pain has been a little better, some days still about the same.  Working on getting in with Dr. Madaline Guthrie today.  Anxiety was about 8/10 prior, 5-6 now.   Trip to [facility] was good but forgot she doesn't do well in the heat so was sick.   Meetings with [facility] have been going well.   Review of Systems  Constitutional: Negative for malaise/fatigue.  Eyes: Negative for double vision.  Respiratory: Negative for shortness of breath.   Cardiovascular: Negative for chest pain and palpitations.  Gastrointestinal: Negative for abdominal pain, constipation, diarrhea, nausea and vomiting.  Genitourinary: Negative for dysuria.  Musculoskeletal: Positive for joint pain and myalgias.  Skin: Negative for rash.  Neurological: Positive for headaches. Negative for dizziness.  Endo/Heme/Allergies: Does not bruise/bleed easily.      Observations/Objective:  Pt sounds less anxious today.   Assessment and Plan:  1. Anxiety disorder due to medical condition Continue increase in buspar. Going well.   2. Chronic pain syndrome Get in with dr. Madaline Guthrie for further pain management as he knows her case and history well.    Follow Up Instructions: call back to  schedule after Dr Madaline Guthrie appt.    I discussed the assessment and treatment plan with the patient and/or parent/guardian. They were provided an opportunity to ask questions and all were answered. They agreed with the plan and demonstrated an understanding of the instructions.   They were advised to call back or seek an in-person evaluation in the emergency room if the symptoms worsen or if the condition fails to improve as anticipated.  I provided 15 minutes of non-face-to-face time and 0 minutes of care coordination during this encounter I was located at off site during this encounter.  Alfonso Ramus, FNP

## 2019-05-31 ENCOUNTER — Other Ambulatory Visit: Payer: Self-pay

## 2019-05-31 ENCOUNTER — Ambulatory Visit (INDEPENDENT_AMBULATORY_CARE_PROVIDER_SITE_OTHER): Admitting: Licensed Clinical Social Worker

## 2019-05-31 DIAGNOSIS — F064 Anxiety disorder due to known physiological condition: Secondary | ICD-10-CM | POA: Diagnosis not present

## 2019-05-31 NOTE — BH Specialist Note (Signed)
Integrated Behavioral Health via Telemedicine Video Visit  05/31/2019 Michele Lucking [id number]  Number of Integrated Behavioral Health visits: 5 Session Start time: 9:00  Session End time: 9:31 Total time: 31 mins  Referring Provider: Candida Peeling, NP Type of Visit: Video Patient/Family location: Home [facility] Provider location: [facility] All persons participating in visit: Pt and [facility]  Confirmed patient's address: Yes  Confirmed patient's phone number: Yes  Any changes to demographics: No   Confirmed patient's insurance: Yes  Any changes to patient's insurance: No   Discussed confidentiality: Yes   I connected with [name]  by a video enabled telemedicine application and verified that I am speaking with the correct person using two identifiers.     I discussed the limitations of evaluation and management by telemedicine and the availability of in person appointments.  I discussed that the purpose of this visit is to provide behavioral health care while limiting exposure to the novel coronavirus.   Discussed there is a possibility of technology failure and discussed alternative modes of communication if that failure occurs.  I discussed that engaging in this video visit, they consent to the provision of behavioral healthcare and the services will be billed under their insurance.  Patient and/or legal guardian expressed understanding and consented to video visit: Yes   PRESENTING CONCERNS: Patient and/or family reports the following symptoms/concerns: pt reports having been able to reach out to her parents more and share her feelings and worries with them. Pt reports feeling like she is wasting time, as she has graduated and does not yet have a job. Pt feels frustration, anger, and sadness at current events Duration of problem: ongoing mood concerns; Severity of problem: severe  STRENGTHS (Protective Factors/Coping Skills): Pt familiar w/ counseling relationship Pt has care team to  include medical provider that manages medications Pt insightful around her experiences  GOALS ADDRESSED: Patient will: 1.  Reduce symptoms of: anxiety  2.  Increase knowledge and/or ability of: healthy habits  3.  Demonstrate ability to: Increase healthy adjustment to current life circumstances and Increase motivation to adhere to plan of care  INTERVENTIONS: Interventions utilized:  Supportive Counseling and Psychoeducation and/or Health Education Standardized Assessments completed: Not Needed  ASSESSMENT: Patient currently experiencing ongoing symptoms of anxiety, to include anxieties about her health, her future, and current events. Pt experiencing minimal self-esteem related to significant and persistent ivalidation..   Patient may benefit from ongoing support from this clinic.  PLAN: 1. Follow up with behavioral health clinician on : 06/06/2019 2. Behavioral recommendations: Pt will continue to share some of her emotions with her parents4 3. Referral(s): Integrated [employer] (In Clinic)  I discussed the assessment and treatment plan with the patient and/or parent/guardian. They were provided an opportunity to ask questions and all were answered. They agreed with the plan and demonstrated an understanding of the instructions.   They were advised to call back or seek an in-person evaluation if the symptoms worsen or if the condition fails to improve as anticipated.  [name]

## 2019-06-06 ENCOUNTER — Ambulatory Visit (INDEPENDENT_AMBULATORY_CARE_PROVIDER_SITE_OTHER): Admitting: Licensed Clinical Social Worker

## 2019-06-06 ENCOUNTER — Other Ambulatory Visit: Payer: Self-pay

## 2019-06-06 DIAGNOSIS — F064 Anxiety disorder due to known physiological condition: Secondary | ICD-10-CM | POA: Diagnosis not present

## 2019-06-06 NOTE — BH Specialist Note (Signed)
Integrated Behavioral Health via Telemedicine Video Visit  06/06/2019 Hind Chesler [id number]  Number of [place] visits: 6 Session Start time: 9:30  Session End time: 10:03 Total time: 33 mins  Referring Provider: Victorino Dike, NP Type of Visit: Video Patient/Family location: Home [facility] Provider location: [facility] All persons participating in visit: Pt and [facility]  Confirmed patient's address: Yes  Confirmed patient's phone number: Yes  Any changes to demographics: No   Confirmed patient's insurance: Yes  Any changes to patient's insurance: No   Discussed confidentiality: Yes   I connected with [name]  by a video enabled telemedicine application and verified that I am speaking with the correct person using two identifiers.     I discussed the limitations of evaluation and management by telemedicine and the availability of in person appointments.  I discussed that the purpose of this visit is to provide behavioral health care while limiting exposure to the novel coronavirus.   Discussed there is a possibility of technology failure and discussed alternative modes of communication if that failure occurs.  I discussed that engaging in this video visit, they consent to the provision of behavioral healthcare and the services will be billed under their insurance.  Patient and/or legal guardian expressed understanding and consented to video visit: Yes   PRESENTING CONCERNS: Patient and/or family reports the following symptoms/concerns: Pt reports ongoing anxieties about her health and the next steps she will take following graduation Duration of problem: ongoing anxiety concerns; Severity of problem: severe  STRENGTHS (Protective Factors/Coping Skills): Pt familiar w/ counseling relationship Pt insightful and reflective around her experiences Pt engaged in counseling and self-reflection   GOALS ADDRESSED: Patient will: 1.  Reduce symptoms of: anxiety  2.   Increase knowledge and/or ability of: healthy habits  3.  Demonstrate ability to: Increase healthy adjustment to current life circumstances and Increase motivation to adhere to plan of care  INTERVENTIONS: Interventions utilized:  Solution-Focused Strategies, Behavioral Activation, Brief CBT, Supportive Counseling and Psychoeducation and/or Health Education Standardized Assessments completed: Not Needed  ASSESSMENT: Patient currently experiencing ongoing symptoms of anxiety, to include anxieties about her health, future, and current events Pt experiencing difficulty w/ self-esteem and advocating for self and needs.   Patient may benefit from ongoing support from this clinic.  PLAN: 1. Follow up with behavioral health clinician on : 06/14/2019 2. Behavioral recommendations: Pt will continue to think of ways to ask for what she needs 3. Referral(s): [place] (In Clinic)  I discussed the assessment and treatment plan with the patient and/or parent/guardian. They were provided an opportunity to ask questions and all were answered. They agreed with the plan and demonstrated an understanding of the instructions.   They were advised to call back or seek an in-person evaluation if the symptoms worsen or if the condition fails to improve as anticipated.  [name]

## 2019-06-14 ENCOUNTER — Other Ambulatory Visit: Payer: Self-pay

## 2019-06-14 ENCOUNTER — Ambulatory Visit (INDEPENDENT_AMBULATORY_CARE_PROVIDER_SITE_OTHER): Admitting: Licensed Clinical Social Worker

## 2019-06-14 DIAGNOSIS — G479 Sleep disorder, unspecified: Secondary | ICD-10-CM

## 2019-06-14 DIAGNOSIS — F064 Anxiety disorder due to known physiological condition: Secondary | ICD-10-CM

## 2019-06-14 NOTE — BH Specialist Note (Signed)
Integrated Behavioral Health via Telemedicine Video Visit  06/14/2019 Susan George [id number]  Number of [place] visits: 7 Session Start time: 9:30  Session End time: 9:56 Total time: 26 mins  Referring Provider: Victorino Dike, NP Type of Visit: Video Patient/Family location: Home [facility] Provider location: [facility] All persons participating in visit: Pt and [facility]  Confirmed patient's address: Yes  Confirmed patient's phone number: Yes  Any changes to demographics: No   Confirmed patient's insurance: Yes  Any changes to patient's insurance: No   Discussed confidentiality: Yes   I connected with Susan George by a video enabled telemedicine application and verified that I am speaking with the correct person using two identifiers.     I discussed the limitations of evaluation and management by telemedicine and the availability of in person appointments.  I discussed that the purpose of this visit is to provide behavioral health care while limiting exposure to the novel coronavirus.   Discussed there is a possibility of technology failure and discussed alternative modes of communication if that failure occurs.  I discussed that engaging in this video visit, they consent to the provision of behavioral healthcare and the services will be billed under their insurance.  Patient and/or legal guardian expressed understanding and consented to video visit: Yes   PRESENTING CONCERNS: Patient and/or family reports the following symptoms/concerns: Pt reports ongoing anxieties and frustrations around current events, as well as uncertainty of future. Duration of problem: ongoing anxiety; Severity of problem: severe  STRENGTHS (Protective Factors/Coping Skills): Pt familiar w/ counseling relationship and coping strategies Pt insightful and reflective around her experiences and emotions  GOALS ADDRESSED: Patient will: 1.  Reduce symptoms of: anxiety  2.  Increase knowledge and/or  ability of: healthy habits  3.  Demonstrate ability to: Increase healthy adjustment to current life circumstances  INTERVENTIONS: Interventions utilized:  Supportive Counseling and Psychoeducation and/or Health Education Standardized Assessments completed: Not Needed  ASSESSMENT: Patient currently experiencing ongoing symptoms of anxiety, to include worries about her health, future, and current events. Pt experiencing difficulty feeling represented, and low self-esteem.   Patient may benefit from ongoing support from this clinic.  PLAN: 1. Follow up with behavioral health clinician on : 06/20/2019 2. Behavioral recommendations: Pt will reflect on her reaction to different stressors (email); [facility] will share podcast w/ pt via email 3. Referral(s): [place] (In Clinic)  I discussed the assessment and treatment plan with the patient and/or parent/guardian. They were provided an opportunity to ask questions and all were answered. They agreed with the plan and demonstrated an understanding of the instructions.   They were advised to call back or seek an in-person evaluation if the symptoms worsen or if the condition fails to improve as anticipated.  [name]

## 2019-06-20 ENCOUNTER — Ambulatory Visit (INDEPENDENT_AMBULATORY_CARE_PROVIDER_SITE_OTHER): Admitting: Licensed Clinical Social Worker

## 2019-06-20 ENCOUNTER — Other Ambulatory Visit: Payer: Self-pay

## 2019-06-20 DIAGNOSIS — G479 Sleep disorder, unspecified: Secondary | ICD-10-CM

## 2019-06-20 DIAGNOSIS — F064 Anxiety disorder due to known physiological condition: Secondary | ICD-10-CM

## 2019-06-20 NOTE — BH Specialist Note (Signed)
Integrated Behavioral Health via Telemedicine Video Visit  06/20/2019 Susan George [id number]  Number of [place] visits: 8 Session Start time: 9:30  Session End time: 10:01 Total time: 31 mins  Referring Provider: Victorino Dike, NP Type of Visit: Video Patient/Family location: Home [facility] Provider location: [facility] All persons participating in visit: Pt and [facility]  Confirmed patient's address: Yes  Confirmed patient's phone number: Yes  Any changes to demographics: No   Confirmed patient's insurance: Yes  Any changes to patient's insurance: No   Discussed confidentiality: Yes   I connected with [name]  by a video enabled telemedicine application and verified that I am speaking with the correct person using two identifiers.     I discussed the limitations of evaluation and management by telemedicine and the availability of in person appointments.  I discussed that the purpose of this visit is to provide behavioral health care while limiting exposure to the novel coronavirus.   Discussed there is a possibility of technology failure and discussed alternative modes of communication if that failure occurs.  I discussed that engaging in this video visit, they consent to the provision of behavioral healthcare and the services will be billed under their insurance.  Patient and/or legal guardian expressed understanding and consented to video visit: Yes   PRESENTING CONCERNS: Patient and/or family reports the following symptoms/concerns: Pt reports continued anxiety and uncertainty about future, as well as ongoing anxiety and depression as related to medical diagnosis.  Duration of problem: ongoing; Severity of problem: severe  STRENGTHS (Protective Factors/Coping Skills): Pt insightful and reflective around her experiences and emotions  GOALS ADDRESSED: Patient will: 1.  Reduce symptoms of: anxiety  2.  Increase knowledge and/or ability of: healthy habits  3.   Demonstrate ability to: Increase healthy adjustment to current life circumstances  INTERVENTIONS: Interventions utilized:  Behavioral Activation, Brief CBT, Supportive Counseling and Psychoeducation and/or Health Education Standardized Assessments completed: Not Needed  ASSESSMENT: Patient currently experiencing ongoing symptoms of anxiety, including worries about her own health, that of her family, her future, and current events. Pt experiencing feelings of invalidation and low self-esteem.   Patient may benefit from ongoing support from this clinic.  PLAN: 1. Follow up with behavioral health clinician on : 06/27/2019 2. Behavioral recommendations: Pt will make a list of all the things she hesitates to say to people in authority 3. Referral(s): [place] (In Clinic)  I discussed the assessment and treatment plan with the patient and/or parent/guardian. They were provided an opportunity to ask questions and all were answered. They agreed with the plan and demonstrated an understanding of the instructions.   They were advised to call back or seek an in-person evaluation if the symptoms worsen or if the condition fails to improve as anticipated.  [name]

## 2019-06-27 ENCOUNTER — Ambulatory Visit (INDEPENDENT_AMBULATORY_CARE_PROVIDER_SITE_OTHER): Admitting: Licensed Clinical Social Worker

## 2019-06-27 ENCOUNTER — Other Ambulatory Visit: Payer: Self-pay

## 2019-06-27 DIAGNOSIS — F064 Anxiety disorder due to known physiological condition: Secondary | ICD-10-CM | POA: Diagnosis not present

## 2019-06-27 DIAGNOSIS — G479 Sleep disorder, unspecified: Secondary | ICD-10-CM

## 2019-06-27 NOTE — BH Specialist Note (Signed)
Integrated Behavioral Health via Telemedicine Video Visit  06/27/2019 [name] [id number]  Number of [place] visits: 9 Session Start time: 3:04  Session End time: 3:35 Total time: 31 mins  Referring Provider: Victorino Dike, NP Type of Visit: Video Patient/Family location: Home [facility] Provider location: [facility] All persons participating in visit: Pt and [facility]  Confirmed patient's address: Yes  Confirmed patient's phone number: Yes  Any changes to demographics: No   Confirmed patient's insurance: Yes  Any changes to patient's insurance: No   Discussed confidentiality: Yes   I connected with Susan George by a video enabled telemedicine application and verified that I am speaking with the correct person using two identifiers.     I discussed the limitations of evaluation and management by telemedicine and the availability of in person appointments.  I discussed that the purpose of this visit is to provide behavioral health care while limiting exposure to the novel coronavirus.   Discussed there is a possibility of technology failure and discussed alternative modes of communication if that failure occurs.  I discussed that engaging in this video visit, they consent to the provision of behavioral healthcare and the services will be billed under their insurance.  Patient and/or legal guardian expressed understanding and consented to video visit: Yes   PRESENTING CONCERNS: Patient and/or family reports the following symptoms/concerns: Pt reports recent and ongoing frustrations with health insurance. Pt also reports recent physical concerns and difficulty getting the care she needs. Pt reports feeling badly about asking for help, feels like a burden when she has needs Duration of problem: ongoing health concerns; Severity of problem: severe  STRENGTHS (Protective Factors/Coping Skills): Pt insightful and reflective around her experiences and emotions  GOALS  ADDRESSED: Patient will: 1.  Reduce symptoms of: anxiety  2.  Increase knowledge and/or ability of: healthy habits  3.  Demonstrate ability to: Increase healthy adjustment to current life circumstances  INTERVENTIONS: Interventions utilized:  Behavioral Activation, Brief CBT, Supportive Counseling and Psychoeducation and/or Health Education Standardized Assessments completed: Not Needed  ASSESSMENT: Patient currently experiencing ongoing medical/health concerns leading to feelings of low self-esteem, anxiety, and frustration.   Patient may benefit from ongoing support from this clinic, specifically around her self-talk.  PLAN: 1. Follow up with behavioral health clinician on : 07/10/2019 2. Behavioral recommendations: Pt will notice and record self-talk 3. Referral(s): [place] (In Clinic)  I discussed the assessment and treatment plan with the patient and/or parent/guardian. They were provided an opportunity to ask questions and all were answered. They agreed with the plan and demonstrated an understanding of the instructions.   They were advised to call back or seek an in-person evaluation if the symptoms worsen or if the condition fails to improve as anticipated.  Susan George

## 2019-07-10 ENCOUNTER — Ambulatory Visit (INDEPENDENT_AMBULATORY_CARE_PROVIDER_SITE_OTHER): Admitting: Licensed Clinical Social Worker

## 2019-07-10 DIAGNOSIS — F064 Anxiety disorder due to known physiological condition: Secondary | ICD-10-CM

## 2019-07-10 NOTE — BH Specialist Note (Signed)
Integrated Behavioral Health via Telemedicine Video Visit  07/10/2019 Susan George [id number]  Number of [place] visits: 10 Session Start time: 11:05  Session End time: 11:36 Total time: 31 mins  Referring Provider: Victorino Dike, NP Type of Visit: Video Patient/Family location: Home [facility] Provider location: [facility] All persons participating in visit: Pt and [facility]  Confirmed patient's address: Yes  Confirmed patient's phone number: Yes  Any changes to demographics: No   Confirmed patient's insurance: Yes  Any changes to patient's insurance: No   Discussed confidentiality: Yes   I connected with [name] by a video enabled telemedicine application and verified that I am speaking with the correct person using two identifiers.     I discussed the limitations of evaluation and management by telemedicine and the availability of in person appointments.  I discussed that the purpose of this visit is to provide behavioral health care while limiting exposure to the novel coronavirus.   Discussed there is a possibility of technology failure and discussed alternative modes of communication if that failure occurs.  I discussed that engaging in this video visit, they consent to the provision of behavioral healthcare and the services will be billed under their insurance.  Patient and/or legal guardian expressed understanding and consented to video visit: Yes   PRESENTING CONCERNS: Patient and/or family reports the following symptoms/concerns: Pt reports ongoing feelings of anxiety and depression as related to current social situation, health concerns, and future uncertainties Duration of problem: several years; Severity of problem: severe  STRENGTHS (Protective Factors/Coping Skills): Pt insightful and reflective around her experiences and emotions Pt w/ supportive family  GOALS ADDRESSED: Patient will: 1.  Reduce symptoms of: anxiety  2.  Increase knowledge and/or  ability of: healthy habits  3.  Demonstrate ability to: Increase healthy adjustment to current life circumstances  INTERVENTIONS: Interventions utilized:  Behavioral Activation, Brief CBT, Supportive Counseling and Psychoeducation and/or Health Education Standardized Assessments completed: Not Needed  ASSESSMENT: Patient currently experiencing ongoing symptoms of anxiety and depression as related to health concerns and current life circumstances. Pt experiencing low self-esteem and frustration.   Patient may benefit from continuing to recognize negative self-talk and other cognitive distortions.  PLAN: 1. Follow up with behavioral health clinician on : 07/19/2019 2. Behavioral recommendations: Pt will continue to monitor and note self-talk 3. Referral(s): [place] (In Clinic)  I discussed the assessment and treatment plan with the patient and/or parent/guardian. They were provided an opportunity to ask questions and all were answered. They agreed with the plan and demonstrated an understanding of the instructions.   They were advised to call back or seek an in-person evaluation if the symptoms worsen or if the condition fails to improve as anticipated.  [name]

## 2019-07-12 ENCOUNTER — Other Ambulatory Visit: Payer: Self-pay | Admitting: Family

## 2019-07-12 DIAGNOSIS — F064 Anxiety disorder due to known physiological condition: Secondary | ICD-10-CM

## 2019-07-12 MED ORDER — BUSPIRONE HCL 10 MG PO TABS: 10.0000 mg | ORAL_TABLET | Freq: Three times a day (TID) | ORAL | 1 refills | Status: DC

## 2019-07-19 ENCOUNTER — Ambulatory Visit (INDEPENDENT_AMBULATORY_CARE_PROVIDER_SITE_OTHER): Admitting: Licensed Clinical Social Worker

## 2019-07-19 DIAGNOSIS — F064 Anxiety disorder due to known physiological condition: Secondary | ICD-10-CM | POA: Diagnosis not present

## 2019-07-19 NOTE — BH Specialist Note (Signed)
Integrated Behavioral Health via Telemedicine Video Visit  07/19/2019 Susan George [id number]  Number of [place] visits: 11 Session Start time: 10:00  Session End time: 10:30 Total time: 31 mins  Referring Provider: Victorino Dike, NP Type of Visit: Video Patient/Family location: Home [facility] Provider location: [facility] All persons participating in visit: Pt and [facility]  Confirmed patient's address: Yes  Confirmed patient's phone number: Yes  Any changes to demographics: No   Confirmed patient's insurance: Yes  Any changes to patient's insurance: No   Discussed confidentiality: Yes   I connected with [name] by a video enabled telemedicine application and verified that I am speaking with the correct person using two identifiers.     I discussed the limitations of evaluation and management by telemedicine and the availability of in person appointments.  I discussed that the purpose of this visit is to provide behavioral health care while limiting exposure to the novel coronavirus.   Discussed there is a possibility of technology failure and discussed alternative modes of communication if that failure occurs.  I discussed that engaging in this video visit, they consent to the provision of behavioral healthcare and the services will be billed under their insurance.  Patient and/or legal guardian expressed understanding and consented to video visit: Yes   PRESENTING CONCERNS: Patient and/or family reports the following symptoms/concerns: Pt reports feeling stressed out and overwhelmed by both her health and the uncertainty of the future. Pt reports several small incidents throughout the week that have built upon each other and has affected her stress level. Duration of problem: ongoing; Severity of problem: severe  STRENGTHS (Protective Factors/Coping Skills): Pt insightful about her experiences Pt familiar w/ counseling relationship  GOALS ADDRESSED: Patient  will: 1.  Reduce symptoms of: anxiety  2.  Increase knowledge and/or ability of: healthy habits  3.  Demonstrate ability to: Increase healthy adjustment to current life circumstances  INTERVENTIONS: Interventions utilized:  Solution-Focused Strategies, Mindfulness or Relaxation Training, Brief CBT, Supportive Counseling and Psychoeducation and/or Health Education Standardized Assessments completed: Not Needed  ASSESSMENT: Patient currently experiencing ongoing anxiety and depression, as evidenced by pt's report. Pt also experiencing low-self esteem and self-worth.   Patient may benefit from recognizing and labeling thoughts. Pt may also benefit from ongoing support from this clinic.  PLAN: 1. Follow up with behavioral health clinician on : 07/26/2019 2. Behavioral recommendations: Pt will practice naming thoughts and emotions, pt will also practice guided mindfulness 3. Referral(s): [place] (In Clinic)  I discussed the assessment and treatment plan with the patient and/or parent/guardian. They were provided an opportunity to ask questions and all were answered. They agreed with the plan and demonstrated an understanding of the instructions.   They were advised to call back or seek an in-person evaluation if the symptoms worsen or if the condition fails to improve as anticipated.  [name]

## 2019-07-26 ENCOUNTER — Ambulatory Visit (INDEPENDENT_AMBULATORY_CARE_PROVIDER_SITE_OTHER): Admitting: Licensed Clinical Social Worker

## 2019-07-26 DIAGNOSIS — F064 Anxiety disorder due to known physiological condition: Secondary | ICD-10-CM | POA: Diagnosis not present

## 2019-07-26 NOTE — BH Specialist Note (Signed)
Integrated Behavioral Health via Telemedicine Video Visit  07/26/2019 Susan George [id number]  Number of [place] visits: 12 Session Start time: 11:00  Session End time: 11:32 Total time: 32 mins  Referring Provider: Victorino Dike, NP Type of Visit: Video Patient/Family location: Home [facility] Provider location: [facility] All persons participating in visit: Pt and [facility]  Confirmed patient's address: Yes  Confirmed patient's phone number: Yes  Any changes to demographics: No   Confirmed patient's insurance: Yes  Any changes to patient's insurance: No   Discussed confidentiality: Yes   I connected with [name]  by a video enabled telemedicine application and verified that I am speaking with the correct person using two identifiers.     I discussed the limitations of evaluation and management by telemedicine and the availability of in person appointments.  I discussed that the purpose of this visit is to provide behavioral health care while limiting exposure to the novel coronavirus.   Discussed there is a possibility of technology failure and discussed alternative modes of communication if that failure occurs.  I discussed that engaging in this video visit, they consent to the provision of behavioral healthcare and the services will be billed under their insurance.  Patient and/or legal guardian expressed understanding and consented to video visit: Yes   PRESENTING CONCERNS: Patient and/or family reports the following symptoms/concerns: Pt reports ongoing stress with her health, next steps following graduation, and state of community. Pt reports having gotten in touch w/ someone who shares in her dx, and has felt supported and heard Duration of problem: ongoing; Severity of problem: severe  STRENGTHS (Protective Factors/Coping Skills): Pt insightful about her experiences Pt familiar w/ counseling relationship  GOALS ADDRESSED: Patient will: 1.  Reduce symptoms  of: anxiety  2.  Increase knowledge and/or ability of: healthy habits  3.  Demonstrate ability to: Increase healthy adjustment to current life circumstances  INTERVENTIONS: Interventions utilized:  Solution-Focused Strategies, Brief CBT, Supportive Counseling and Psychoeducation and/or Health Education Standardized Assessments completed: Not Needed  ASSESSMENT: Patient currently experiencing ongoing anxiety and depression, as evidenced by pt's report. Pt also experiencing ongoing feelings of low self-esteem and self-worth. Pt experiencing new connection to someone w/ same dx.   Patient may benefit from ongoing support from this clinic. Pt may also benefit from continuing to communicate w/ person w/ shared dx.  PLAN: 1. Follow up with behavioral health clinician on : 08/02/2019 2. Behavioral recommendations: Pt will keep up with new friend to talk about shared experiences 3. Referral(s): [place] (In Clinic)  I discussed the assessment and treatment plan with the patient and/or parent/guardian. They were provided an opportunity to ask questions and all were answered. They agreed with the plan and demonstrated an understanding of the instructions.   They were advised to call back or seek an in-person evaluation if the symptoms worsen or if the condition fails to improve as anticipated.  [name]

## 2019-08-02 ENCOUNTER — Ambulatory Visit: Admitting: Licensed Clinical Social Worker

## 2019-08-05 ENCOUNTER — Ambulatory Visit (INDEPENDENT_AMBULATORY_CARE_PROVIDER_SITE_OTHER): Admitting: Licensed Clinical Social Worker

## 2019-08-05 DIAGNOSIS — F064 Anxiety disorder due to known physiological condition: Secondary | ICD-10-CM | POA: Diagnosis not present

## 2019-08-05 NOTE — BH Specialist Note (Signed)
Integrated Behavioral Health via Telemedicine Video Visit  08/05/2019 Susan George [id number]  Number of [place] visits: 52 Session Start time: 9:45  Session End time: 10:15 Total time: 30 minutes  Referring Provider: Victorino Dike, NP Type of Visit: Video Patient/Family location: Home [facility] Provider location: [facility] All persons participating in visit: Pt and [facility]  Confirmed patient's address: Yes  Confirmed patient's phone number: Yes  Any changes to demographics: No   Confirmed patient's insurance: Yes  Any changes to patient's insurance: No   Discussed confidentiality: Yes   I connected with Susan George by a video enabled telemedicine application and verified that I am speaking with the correct person using two identifiers.     I discussed the limitations of evaluation and management by telemedicine and the availability of in person appointments.  I discussed that the purpose of this visit is to provide behavioral health care while limiting exposure to the novel coronavirus.   Discussed there is a possibility of technology failure and discussed alternative modes of communication if that failure occurs.  I discussed that engaging in this video visit, they consent to the provision of behavioral healthcare and the services will be billed under their insurance.  Patient and/or legal guardian expressed understanding and consented to video visit: Yes   PRESENTING CONCERNS: Patient and/or family reports the following symptoms/concerns: Pt reports ongoing anxieties and feeling out of control due to life circumstances and medical condition. Pt reports ongoing sleep concerns and having discussed them with pain management and her PCP. Mom and pt expressed interest in sleep study for pt Duration of problem: ongoing; Severity of problem: severe  STRENGTHS (Protective Factors/Coping Skills): Pt insightful about her experiences Pt familiar w/ counseling  relationship  GOALS ADDRESSED: Patient will: 1.  Reduce symptoms of: anxiety  2.  Increase knowledge and/or ability of: healthy habits  3.  Demonstrate ability to: Increase healthy adjustment to current life circumstances  INTERVENTIONS: Interventions utilized:  Solution-Focused Strategies, Mindfulness or Psychologist, educational, Veterinary surgeon, Supportive Counseling and Psychoeducation and/or Health Education Standardized Assessments completed: Not Needed  ASSESSMENT: Patient currently experiencing ongoing symptoms of anxiety and depression, as evidenced by pt's report. Pt also experiencing ongoing feelings of low self-esteem and self-worth. Pt experiencing an increase in connections w/ similar lived experiences.   Patient may benefit from ongoing support from this clinic, as well as other medical connections to help address sleep concerns.  PLAN: 1. Follow up with behavioral health clinician on : 08/09/2019 2. Behavioral recommendations: Pt will continue to talk to the person she met with her dx. Pt will also assert her needs to her sister 3. Referral(s): [place] (In Clinic)  I discussed the assessment and treatment plan with the patient and/or parent/guardian. They were provided an opportunity to ask questions and all were answered. They agreed with the plan and demonstrated an understanding of the instructions.   They were advised to call back or seek an in-person evaluation if the symptoms worsen or if the condition fails to improve as anticipated.  [name]

## 2019-08-09 ENCOUNTER — Ambulatory Visit (INDEPENDENT_AMBULATORY_CARE_PROVIDER_SITE_OTHER): Admitting: Licensed Clinical Social Worker

## 2019-08-09 DIAGNOSIS — F064 Anxiety disorder due to known physiological condition: Secondary | ICD-10-CM | POA: Diagnosis not present

## 2019-08-09 NOTE — BH Specialist Note (Signed)
Integrated Behavioral Health via Telemedicine Video Visit  08/09/2019 Susan George [id number]  Number of [place] visits: 42 Session Start time: 11:45  Session End time: 12:17 Total time: 32 mins  Referring Provider: Victorino Dike, NP Type of Visit: Video Patient/Family location: Home [facility] Provider location: [facility] All persons participating in visit: Pt and [facility]  Confirmed patient's address: Yes  Confirmed patient's phone number: Yes  Any changes to demographics: No   Confirmed patient's insurance: Yes  Any changes to patient's insurance: No   Discussed confidentiality: Yes   I connected with Susan George by a video enabled telemedicine application and verified that I am speaking with the correct person using two identifiers.     I discussed the limitations of evaluation and management by telemedicine and the availability of in person appointments.  I discussed that the purpose of this visit is to provide behavioral health care while limiting exposure to the novel coronavirus.   Discussed there is a possibility of technology failure and discussed alternative modes of communication if that failure occurs.  I discussed that engaging in this video visit, they consent to the provision of behavioral healthcare and the services will be billed under their insurance.  Patient and/or legal guardian expressed understanding and consented to video visit: Yes   PRESENTING CONCERNS: Patient and/or family reports the following symptoms/concerns: Pt reports feeling both more and less stressed. Pt reports having had a job interview, and is excited about possibility of job, but that employment comes with its own set of concerns related to her healthcare needs. Pt reports recent negative reaction to new medication, was advised by prescribing provider to discontinue use. Pt reports that prescribing provider has indicated potential use of non-traditional treatment methods, pt is feeling  wary about asking questions. Duration of problem: ongoing anxiety; Severity of problem: severe  STRENGTHS (Protective Factors/Coping Skills): Pt insightful about her experiences Pt familiar w/ counseling relationship Pt willing to get out of her comfort zone  GOALS ADDRESSED: Patient will: 1.  Reduce symptoms of: anxiety  2.  Increase knowledge and/or ability of: coping skills and healthy habits  3.  Demonstrate ability to: Increase healthy adjustment to current life circumstances  INTERVENTIONS: Interventions utilized:  Solution-Focused Strategies, Mindfulness or Relaxation Training, Brief CBT, Supportive Counseling and Psychoeducation and/or Health Education Standardized Assessments completed: Not Needed  ASSESSMENT: Patient currently experiencing ongoing symptoms of anxiety and depression. Pt experiencing ongoing health concerns. Pt experiencing minimal self-esteem.   Patient may benefit from ongoing support from this clinic.  PLAN: 1. Follow up with behavioral health clinician on : 08/16/2019 2. Behavioral recommendations: Pt will draft email to MD with questions about possibilities of treatment 3. Referral(s): [place] (In Clinic)  I discussed the assessment and treatment plan with the patient and/or parent/guardian. They were provided an opportunity to ask questions and all were answered. They agreed with the plan and demonstrated an understanding of the instructions.   They were advised to call back or seek an in-person evaluation if the symptoms worsen or if the condition fails to improve as anticipated.  [name]

## 2019-08-13 ENCOUNTER — Other Ambulatory Visit: Payer: Self-pay

## 2019-08-13 DIAGNOSIS — G479 Sleep disorder, unspecified: Secondary | ICD-10-CM

## 2019-08-13 MED ORDER — HYDROXYZINE HCL 25 MG PO TABS: 25.0000 mg | ORAL_TABLET | Freq: Three times a day (TID) | ORAL | 0 refills | Status: AC | PRN

## 2019-08-16 ENCOUNTER — Ambulatory Visit: Admitting: Licensed Clinical Social Worker

## 2019-08-21 ENCOUNTER — Ambulatory Visit (INDEPENDENT_AMBULATORY_CARE_PROVIDER_SITE_OTHER): Admitting: Licensed Clinical Social Worker

## 2019-08-21 DIAGNOSIS — F064 Anxiety disorder due to known physiological condition: Secondary | ICD-10-CM | POA: Diagnosis not present

## 2019-08-21 DIAGNOSIS — G479 Sleep disorder, unspecified: Secondary | ICD-10-CM | POA: Diagnosis not present

## 2019-08-21 NOTE — BH Specialist Note (Signed)
Integrated Behavioral Health via Telemedicine Video Visit  08/21/2019 Susan George [id number]  Number of [place] visits: 91 Session Start time: 4:52  Session End time: 5:14 Total time: 22 mins  Referring Provider: Victorino Dike, NP Type of Visit: Video Patient/Family location: Home [facility] Provider location: [facility] All persons participating in visit: Pt and [facility]  Confirmed patient's address: Yes  Confirmed patient's phone number: Yes  Any changes to demographics: No   Confirmed patient's insurance: Yes  Any changes to patient's insurance: No   Discussed confidentiality: Yes   I connected with [name] by a video enabled telemedicine application and verified that I am speaking with the correct person using two identifiers.     I discussed the limitations of evaluation and management by telemedicine and the availability of in person appointments.  I discussed that the purpose of this visit is to provide behavioral health care while limiting exposure to the novel coronavirus.   Discussed there is a possibility of technology failure and discussed alternative modes of communication if that failure occurs.  I discussed that engaging in this video visit, they consent to the provision of behavioral healthcare and the services will be billed under their insurance.  Patient and/or legal guardian expressed understanding and consented to video visit: Yes   PRESENTING CONCERNS: Patient and/or family reports the following symptoms/concerns: pt reports ongoing feelings of anxiety, stress, and depression as related to medical conditions, as well as beginning new job. Pt reports feeling like she has to live up to other people's expectations. Pt sometimes feels inadequate and like an imposter, that others don't take her seriously Duration of problem: years; Severity of problem: severe  STRENGTHS (Protective Factors/Coping Skills): Pt insightful about experiences Pt familiar w/  counseling relationship Pt has supportive family  GOALS ADDRESSED: Patient will: 1.  Reduce symptoms of: anxiety and stress  2.  Increase knowledge and/or ability of: coping skills, healthy habits and stress reduction  3.  Demonstrate ability to: Increase healthy adjustment to current life circumstances  INTERVENTIONS: Interventions utilized:  Solution-Focused Strategies, Mindfulness or Relaxation Training, Behavioral Activation, Brief CBT and Supportive Counseling Standardized Assessments completed: Not Needed  ASSESSMENT: Patient currently experiencing ongoing symptoms of anxiety, depression, and stress, as related to ongoing medical conditions and low self-esteem.   Patient may benefit from ongoing support from this clinic.  PLAN: 1. Follow up with behavioral health clinician on : 08/28/2019 2. Behavioral recommendations: Pt will scale her level of anxiety at various points throughout the day 3. Referral(s): [place] (In Clinic)  I discussed the assessment and treatment plan with the patient and/or parent/guardian. They were provided an opportunity to ask questions and all were answered. They agreed with the plan and demonstrated an understanding of the instructions.   They were advised to call back or seek an in-person evaluation if the symptoms worsen or if the condition fails to improve as anticipated.  [name]

## 2019-08-28 ENCOUNTER — Ambulatory Visit (INDEPENDENT_AMBULATORY_CARE_PROVIDER_SITE_OTHER): Admitting: Licensed Clinical Social Worker

## 2019-08-28 DIAGNOSIS — G479 Sleep disorder, unspecified: Secondary | ICD-10-CM

## 2019-08-28 DIAGNOSIS — F064 Anxiety disorder due to known physiological condition: Secondary | ICD-10-CM | POA: Diagnosis not present

## 2019-08-28 NOTE — BH Specialist Note (Signed)
Integrated Behavioral Health via Telemedicine Video Visit  08/28/2019 Susan George [id number]  Number of [place] visits: 68 Session Start time: 4:40  Session End time: 5:14 Total time: 34 mins  Referring Provider: Victorino Dike, NP Type of Visit: Video Patient/Family location: Home [facility] Provider location: [facility] All persons participating in visit: Pt and [facility]  Confirmed patient's address: Yes  Confirmed patient's phone number: Yes  Any changes to demographics: No   Confirmed patient's insurance: Yes  Any changes to patient's insurance: No   Discussed confidentiality: Yes   I connected with [name] by a video enabled telemedicine application and verified that I am speaking with the correct person using two identifiers.     I discussed the limitations of evaluation and management by telemedicine and the availability of in person appointments.  I discussed that the purpose of this visit is to provide behavioral health care while limiting exposure to the novel coronavirus.   Discussed there is a possibility of technology failure and discussed alternative modes of communication if that failure occurs.  I discussed that engaging in this video visit, they consent to the provision of behavioral healthcare and the services will be billed under their insurance.  Patient and/or legal guardian expressed understanding and consented to video visit: Yes   PRESENTING CONCERNS: Patient and/or family reports the following symptoms/concerns: Pt reports ongoing stress adjusting to new job as well as new semester at school. Pt feels overwhelmed often, feels like an imposter, and that other people don't take her seriously. Duration of problem: ongoing mood difficulty; Severity of problem: severe  STRENGTHS (Protective Factors/Coping Skills): Pt insightful about experiences Pt familia w/ counseling relationship Pt has supportive family  GOALS ADDRESSED: Patient will: 1.   Reduce symptoms of: anxiety and stress  2.  Increase knowledge and/or ability of: coping skills, healthy habits and stress reduction  3.  Demonstrate ability to: Increase healthy adjustment to current life circumstances  INTERVENTIONS: Interventions utilized:  Solution-Focused Strategies, Brief CBT, Supportive Counseling and Psychoeducation and/or Health Education Standardized Assessments completed: Not Needed  ASSESSMENT: Patient currently experiencing ongoing symptoms of anxiety, depression, and stress. Pt experiencing hesitation asking for what she needs, is afraid to ask for too much.   Patient may benefit from ongoing support from this clinic.  PLAN: 1. Follow up with behavioral health clinician on : 09/05/2019 2. Behavioral recommendations: speaking to a professor about opening up quizzes earlier 3. Referral(s): [place] (In Clinic)  I discussed the assessment and treatment plan with the patient and/or parent/guardian. They were provided an opportunity to ask questions and all were answered. They agreed with the plan and demonstrated an understanding of the instructions.   They were advised to call back or seek an in-person evaluation if the symptoms worsen or if the condition fails to improve as anticipated.  [name]

## 2019-09-05 ENCOUNTER — Ambulatory Visit (INDEPENDENT_AMBULATORY_CARE_PROVIDER_SITE_OTHER): Admitting: Licensed Clinical Social Worker

## 2019-09-05 DIAGNOSIS — F064 Anxiety disorder due to known physiological condition: Secondary | ICD-10-CM

## 2019-09-05 NOTE — BH Specialist Note (Signed)
Integrated Behavioral Health via Telemedicine Video Visit  09/05/2019 Susan George [id number]  Number of [place] visits: 67 Session Start time: 4:45  Session End time: 5:21 Total time: 93  Referring Provider: Victorino Dike, NP Type of Visit: Video Patient/Family location: Home [facility] Provider location: [facility] All persons participating in visit: Pt and [facility]  Confirmed patient's address: Yes  Confirmed patient's phone number: Yes  Any changes to demographics: No   Confirmed patient's insurance: Yes  Any changes to patient's insurance: No   Discussed confidentiality: Yes   I connected with Susan George by a video enabled telemedicine application and verified that I am speaking with the correct person using two identifiers.     I discussed the limitations of evaluation and management by telemedicine and the availability of in person appointments.  I discussed that the purpose of this visit is to provide behavioral health care while limiting exposure to the novel coronavirus.   Discussed there is a possibility of technology failure and discussed alternative modes of communication if that failure occurs.  I discussed that engaging in this video visit, they consent to the provision of behavioral healthcare and the services will be billed under their insurance.  Patient and/or legal guardian expressed understanding and consented to video visit: Yes   PRESENTING CONCERNS: Patient and/or family reports the following symptoms/concerns: Pt reports feeling more overwhelmed and frustrated than usual. Several abrupt changes at work and medically have left pt to feel out of control. Pt feels that emotional state is contributing to feeling unwell physically. Pt reports having a hard time taking time for herself or asking for what she needs, is worried about being a burden to others. Duration of problem: years; Severity of problem: severe  STRENGTHS (Protective Factors/Coping  Skills): Pt insightful about experiences Pt familiar w/ counseling relationship Pt has supportive family  GOALS ADDRESSED: Patient will: 1.  Reduce symptoms of: anxiety and stress  2.  Increase knowledge and/or ability of: coping skills, healthy habits and stress reduction  3.  Demonstrate ability to: Increase healthy adjustment to current life circumstances  INTERVENTIONS: Interventions utilized:  Solution-Focused Strategies, Behavioral Activation, Brief CBT, Supportive Counseling and Psychoeducation and/or Health Education Standardized Assessments completed: Not Needed  ASSESSMENT: Patient currently experiencing ongoing and increased symptoms of anxiety, depression, and stress. Pt experiencing low self worth.   Patient may benefit from ongoing support from this clinic.  PLAN: 1. Follow up with behavioral health clinician on : 09/11/2019 2. Behavioral recommendations: Pt will emphasize basic adls; [facility] will administer PHQ-SADS at next visit 3. Referral(s): [place] (In Clinic)  I discussed the assessment and treatment plan with the patient and/or parent/guardian. They were provided an opportunity to ask questions and all were answered. They agreed with the plan and demonstrated an understanding of the instructions.   They were advised to call back or seek an in-person evaluation if the symptoms worsen or if the condition fails to improve as anticipated.  [name]

## 2019-09-11 ENCOUNTER — Ambulatory Visit (INDEPENDENT_AMBULATORY_CARE_PROVIDER_SITE_OTHER): Admitting: Licensed Clinical Social Worker

## 2019-09-11 DIAGNOSIS — F064 Anxiety disorder due to known physiological condition: Secondary | ICD-10-CM

## 2019-09-11 NOTE — BH Specialist Note (Signed)
Susan George via Telemedicine Video Visit  09/11/2019 Susan George [id number]  Number of [place] visits: 68 Session Start time: 4:43  Session End time: 5:17 Total time: 66  Referring Provider: Victorino Dike, NP Type of Visit: Video Patient/Family location: Home Susan George Provider location: Susan George All persons participating in visit: Pt and Susan George  Confirmed patient's address: Yes  Confirmed patient's phone number: Yes  Any changes to demographics: No   Confirmed patient's insurance: Yes  Any changes to patient's insurance: No   Discussed confidentiality: Yes   I connected with Susan George by a video enabled telemedicine application and verified that I am speaking with the correct person using two identifiers.     I discussed the limitations of evaluation and management by telemedicine and the availability of in person appointments.  I discussed that the purpose of this visit is to provide behavioral George care while limiting exposure to the novel coronavirus.   Discussed there is a possibility of technology failure and discussed alternative modes of communication if that failure occurs.  I discussed that engaging in this video visit, they consent to the provision of behavioral healthcare and the services will be billed under their insurance.  Patient and/or legal guardian expressed understanding and consented to video visit: Yes   PRESENTING CONCERNS: Patient and/or family reports the following symptoms/concerns: Ongoing and increased stress around balancing George, work, and school. Pt reports declining George, and has a hard time feeling like she can put herself first when there are other tasks to be done. Pt feels like she is not able to do anything to her standards because of the volume of tasks Duration of problem: anxiety and low self-esteem ongoing for years; Severity of problem: severe  STRENGTHS (Protective Factors/Coping Skills): Pt more and  more comfortable w/ counseling relationship Pt able to recognize areas she would like to change/work on Pt has supportive family  GOALS ADDRESSED: Patient will: 1.  Reduce symptoms of: anxiety and stress  2.  Increase knowledge and/or ability of: coping skills, healthy habits and stress reduction  3.  Demonstrate ability to: Increase healthy adjustment to current life circumstances  INTERVENTIONS: Interventions utilized:  Solution-Focused Strategies, Behavioral Activation, Brief CBT, Supportive Counseling and Psychoeducation and/or George Education Standardized Assessments completed: None at this time, PHQ-SADS at follow up  ASSESSMENT: Patient currently experiencing ongoing and increasing symptoms of stress, anxiety, low self worth, poor self-care abilities, and depression.   Patient may benefit from ongoing support from this George.  PLAN: 1. Follow up with behavioral George clinician on : 09/18/2019 2. Behavioral recommendations: Pt will consider the idea that thoughts are not always accurate. Pt will take medications according to the prescribed schedule; [facility] to administer PHQ-SADS at follow up 3. Referral(s): Susan George (In George)  I discussed the assessment and treatment plan with the patient and/or parent/guardian. They were provided an opportunity to ask questions and all were answered. They agreed with the plan and demonstrated an understanding of the instructions.   They were advised to call back or seek an in-person evaluation if the symptoms worsen or if the condition fails to improve as anticipated.  Susan George

## 2019-09-13 ENCOUNTER — Other Ambulatory Visit: Payer: Self-pay | Admitting: Family

## 2019-09-13 DIAGNOSIS — F064 Anxiety disorder due to known physiological condition: Secondary | ICD-10-CM

## 2019-09-18 ENCOUNTER — Ambulatory Visit (INDEPENDENT_AMBULATORY_CARE_PROVIDER_SITE_OTHER): Admitting: Licensed Clinical Social Worker

## 2019-09-18 DIAGNOSIS — F064 Anxiety disorder due to known physiological condition: Secondary | ICD-10-CM

## 2019-09-18 DIAGNOSIS — G479 Sleep disorder, unspecified: Secondary | ICD-10-CM

## 2019-09-18 NOTE — BH Specialist Note (Addendum)
Integrated Behavioral Health Visit via Telemedicine (Telephone)  09/18/2019 Susan George [id number]   Session Start time: 4:45  Session End time: 5:18 Total time: 70  Referring Provider: Victorino Dike, NP Type of Visit: Telephonic, due to technology issues w/ video visit Patient location: Home [facility] Provider location: [facility] All persons participating in visit: Pt and [facility]  Confirmed patient's address: Yes  Confirmed patient's phone number: Yes  Any changes to demographics: No   Confirmed patient's insurance: Yes  Any changes to patient's insurance: No   Discussed confidentiality: Yes    The following statements were read to the patient and/or legal guardian that are established with the [facility] Provider.  "The purpose of this phone visit is to provide behavioral health care while limiting exposure to the coronavirus (COVID19).  There is a possibility of technology failure and discussed alternative modes of communication if that failure occurs."  "By engaging in this telephone visit, you consent to the provision of healthcare.  Additionally, you authorize for your insurance to be billed for the services provided during this telephone visit."   Patient and/or legal guardian consented to telephone visit: Yes   PRESENTING CONCERNS: Patient and/or family reports the following symptoms/concerns: Pt reports ongoing symptoms of stress, anxiety, low self esteem, depression, and lack of sleep Duration of problem: years; Severity of problem: severe  STRENGTHS (Protective Factors/Coping Skills): Pt has supportive family Pt able to recognize areas she would like to change/work on  GOALS ADDRESSED: Patient will: 1.  Reduce symptoms of: anxiety, depression, insomnia and stress  2.  Increase knowledge and/or ability of: coping skills and healthy habits  3.  Demonstrate ability to: Increase healthy adjustment to current life circumstances, Increase adequate support systems for  patient/family and Improve medication compliance  INTERVENTIONS: Interventions utilized:  Solution-Focused Strategies, Behavioral Activation, Supportive Counseling, Psychoeducation and/or Health Education and Link to [employer] Standardized Assessments completed: PHQ-SADS  PHQ-SADS SCORES 09/18/2019  PHQ-15 Score 25  Total GAD-7 Score 18  a. In the last 4 weeks, have you had an anxiety attack-suddenly feeling fear or panic? Yes  b. Has this ever happened before? Yes  c. Do some of these attacks come suddenly out of the blue-that is, in situations where you don't expect to be nervous or uncomfortable? Yes  PHQ Adolescent Score 19  If you checked off any problems on this questionnaire, how difficult have these problems made it for you to do your work, take care of things at home, or get along with other people? Extremely difficult    ASSESSMENT: Patient currently experiencing elevated symptoms of anxiety, depression, and somatic pains, as evidenced by results of screening tools as well as clinical interview. Pt is experiencing trouble sleeping and difficulty keeping with plan of care and medication compliance.   Patient may benefit from ongoing support from this clinic, as well as reaching out to specialists and establishing care with psychiatry.  PLAN: 1. Follow up with behavioral health clinician on : 09/25/2019 2. Behavioral recommendations: Pt will continue to take meds as prescribed; pt will call a psych office to establish care; Pt will also email pain mgmt md w/ concerns of sleep 3. Referral(s): [place] (In Clinic) and Psychiatrist  [name]

## 2019-09-23 ENCOUNTER — Encounter: Payer: Self-pay | Admitting: Family

## 2019-09-23 ENCOUNTER — Telehealth: Payer: Self-pay

## 2019-09-23 NOTE — Telephone Encounter (Signed)
PHQSADS entered.

## 2019-09-23 NOTE — Telephone Encounter (Signed)
PHQ-SADS Last 3 Score only 09/23/2019 09/18/2019 01/09/2019  PHQ-15 Score 28 25 20   Total GAD-7 Score 19 18 13   Score 20 19 18

## 2019-09-24 NOTE — Addendum Note (Signed)
Addended by: Jason Fila on: 09/24/2019 08:15 AM   Modules accepted: Orders

## 2019-09-25 ENCOUNTER — Ambulatory Visit (INDEPENDENT_AMBULATORY_CARE_PROVIDER_SITE_OTHER): Admitting: Licensed Clinical Social Worker

## 2019-09-25 DIAGNOSIS — F064 Anxiety disorder due to known physiological condition: Secondary | ICD-10-CM | POA: Diagnosis not present

## 2019-09-25 NOTE — BH Specialist Note (Signed)
Integrated Behavioral Health via Telemedicine Video Visit  09/25/2019 Susan George [id number]  Number of [place] visits: 18 Session Start time: 4:25  Session End time: 4:58 Total time: 89  Referring Provider: Victorino Dike, NP Type of Visit: Video Patient/Family location: Home [facility] Provider location: [facility] All persons participating in visit: Pt and Susan George  Confirmed patient's address: Yes  Confirmed patient's phone number: Yes  Any changes to demographics: No   Confirmed patient's insurance: Yes  Any changes to patient's insurance: No   Discussed confidentiality: Yes   I connected with Susan George by a video enabled telemedicine application and verified that I am speaking with the correct person using two identifiers.     I discussed the limitations of evaluation and management by telemedicine and the availability of in person appointments.  I discussed that the purpose of this visit is to provide behavioral health care while limiting exposure to the novel coronavirus.   Discussed there is a possibility of technology failure and discussed alternative modes of communication if that failure occurs.  I discussed that engaging in this video visit, they consent to the provision of behavioral healthcare and the services will be billed under their insurance.  Patient and/or legal guardian expressed understanding and consented to video visit: Yes   PRESENTING CONCERNS: Patient and/or family reports the following symptoms/concerns: Pt reports having taken measures to help herself physically, despite feeling self-conscious about drawing attention to herself. Pt reports having been able to make decisions regarding her health, and has been able to advocate for herself. 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 Pt reports feeling more comfortable in her workplace; being able to  speak up for herself and delegate appropriately. Duration of problem: ongoing mood/health concerns; Severity of problem: severe  STRENGTHS (Protective Factors/Coping Skills): Pt has supportive family Pt w/ hx of counseling relationships  GOALS ADDRESSED: Patient will: 1.  Reduce symptoms of: anxiety and stress  2.  Increase knowledge and/or ability of: coping skills, healthy habits and stress reduction  3.  Demonstrate ability to: Increase healthy adjustment to current life circumstances and Increase adequate support systems for patient/family  INTERVENTIONS: Interventions utilized:  Solution-Focused Strategies, Behavioral Activation, Brief CBT, Supportive Counseling and Psychoeducation and/or Health Education Standardized Assessments completed: Not Needed  ASSESSMENT: Patient currently experiencing ongoing symptoms of stress, anxiety, low self-esteem, poor self-care abilities, and depression. Pt experiencing ongoing chronic illness, affecting mood.   Patient may benefit from ongoing support from this clinic, as well as reaching out to and maintaining connection w/ appropriate medical professionals.  PLAN: 1. Follow up with behavioral health clinician on : 10/02/2019 2. Behavioral recommendations: Pt will keep appt w/ Susan George for 10/1; will consider other experiences and feelings outside of frustration 3. Referral(s): [place] (In Clinic)  I discussed the assessment and treatment plan with the patient and/or parent/guardian. They were provided an opportunity to ask questions and all were answered. They agreed with the plan and demonstrated an understanding of the instructions.   They were advised to call back or seek an in-person evaluation if the symptoms worsen or if the condition fails to improve as anticipated.  Susan George

## 2019-09-26 ENCOUNTER — Ambulatory Visit (INDEPENDENT_AMBULATORY_CARE_PROVIDER_SITE_OTHER): Payer: BC Managed Care – PPO | Admitting: Pediatrics

## 2019-09-26 DIAGNOSIS — G894 Chronic pain syndrome: Secondary | ICD-10-CM

## 2019-09-26 DIAGNOSIS — R404 Transient alteration of awareness: Secondary | ICD-10-CM

## 2019-09-26 DIAGNOSIS — F064 Anxiety disorder due to known physiological condition: Secondary | ICD-10-CM | POA: Diagnosis not present

## 2019-09-26 DIAGNOSIS — N920 Excessive and frequent menstruation with regular cycle: Secondary | ICD-10-CM | POA: Diagnosis not present

## 2019-09-26 DIAGNOSIS — G8929 Other chronic pain: Secondary | ICD-10-CM

## 2019-09-26 DIAGNOSIS — M545 Low back pain: Secondary | ICD-10-CM | POA: Diagnosis not present

## 2019-09-26 DIAGNOSIS — M2550 Pain in unspecified joint: Secondary | ICD-10-CM

## 2019-09-26 MED ORDER — METHYLPREDNISOLONE 4 MG PO TBPK: ORAL_TABLET | ORAL | 0 refills | Status: AC

## 2019-09-26 NOTE — Patient Instructions (Addendum)
For your current pain:  Day 1: 24 mg on day 1 administered as 8 mg (2 tablets) before breakfast, 4 mg (1 tablet) after lunch, 4 mg (1 tablet) after supper, and 8 mg (2 tablets) at bedtime or 24 mg (6 tablets) as a single dose or divided into 2 or 3 doses upon initiation (regardless of time of day). Day 2: 20 mg on day 2 administered as 4 mg (1 tablet) before breakfast, 4 mg (1 tablet) after lunch, 4 mg (1 tablet) after supper, and 8 mg (2 tablets) at bedtime. Day 3: 16 mg on day 3 administered as 4 mg (1 tablet) before breakfast, 4 mg (1 tablet) after lunch, 4 mg (1 tablet) after supper, and 4 mg (1 tablet) at bedtime. Day 4: 12 mg on day 4 administered as 4 mg (1 tablet) before breakfast, 4 mg (1 tablet) after lunch, and 4 mg (1 tablet) at bedtime. Day 5: 8 mg on day 5 administered as 4 mg (1 tablet) before breakfast and 4 mg (1 tablet) at bedtime. Day 6: 4 mg on day 6 administered as 4 mg (1 tablet) before breakfast.  For the staring spells, please have your office in [place] refer you to your local neurologist for assessment and likely EEG to rule out any seizure activity   Biotene for dry mouth

## 2019-09-26 NOTE — Progress Notes (Signed)
THIS RECORD MAY CONTAIN CONFIDENTIAL INFORMATION THAT SHOULD NOT BE RELEASED WITHOUT REVIEW OF THE SERVICE PROVIDER.  Virtual Follow-Up Visit via Video Note  I connected with Susan George 's patient  on 09/26/19 at  2:30 PM EDT by a video enabled telemedicine application and verified that I am speaking with the correct person using two identifiers.    This patient visit was completed through the use of an audio/video or telephone encounter in the setting of the State of Emergency due to the COVID-19 Pandemic.  I discussed that the purpose of this telehealth visit is to provide medical care while limiting exposure to the novel coronavirus.       I discussed the limitations of evaluation and management by telemedicine and the availability of in person appointments.    The patient expressed understanding and agreed to proceed.   The patient was physically located at home in [place] or a state in which I am permitted to provide care. The patient and/or parent/guardian understood that s/he may incur co-pays and cost sharing, and agreed to the telemedicine visit. The visit was reasonable and appropriate under the circumstances given the patient's presentation at the time.   The patient and/or parent/guardian has been advised of the potential risks and limitations of this mode of treatment (including, but not limited to, the absence of in-person examination) and has agreed to be treated using telemedicine. The patient's/patient's family's questions regarding telemedicine have been answered.    As this visit was completed in an ambulatory virtual setting, the patient and/or parent/guardian has also been advised to contact their provider's office for worsening conditions, and seek emergency medical treatment and/or call 911 if the patient deems either necessary.     Susan George is a 23 y.o. female referred by No ref. provider found here today for follow-up of pain and weakness   Growth Chart  Viewed? not applicable  Previsit planning completed:  yes   History was provided by the patient.  PCP Confirmed?  yes  My Chart Activated?   yes    Plan from Last Visit:   Continue medications- back to PCP- awaiting pain management recs   Chief Complaint: increasing pain and weakness   History of Present Illness:   PCP retired and she just found out through [place].  Has been feeling really badly and has needed to use her cane for the past 6 weeks pretty much all the time. She is struggling to make it to her bathroom frequently now. Pain and weakness. It is her whole body. Can't identify a triggering event. She is able to work from home right now. 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   She is taking muscle relaxants and using heating pads and a heated blanket at this time. Ankles have some swelling and sometimes in her wrists. Hip and back joints are currently most painful.   She is currently not sleeping well at all.   Has occassional episodes where she will stand up and blank out. Mom thought that maybe it looked like an absence seizure from what she has seen before. Food and water don't seem to make a difference. Has seen neuro in the distant past.   Having some vaginal bleeding but a lot better than it has been. Pain mgmt checked ferritin and it was ok.   Mood has been ok, but being in pain doesn't make it any easier.   Has been  losing clumps of hair, not sure why. When she goes to brush it, it falls out. Thyroid was fine at pain mgmt.   No LMP recorded. (Menstrual status: Irregular Periods).  Review of Systems  Constitutional: Positive for malaise/fatigue.  Eyes: Negative for double vision.  Respiratory: Negative for shortness of breath.   Cardiovascular: Positive for palpitations. Negative for chest pain.  Gastrointestinal: Positive for abdominal pain. Negative for constipation, diarrhea, nausea and vomiting.   Genitourinary: Negative for dysuria.  Musculoskeletal: Positive for joint pain and myalgias.  Skin: Negative for rash.  Neurological: Positive for weakness and headaches. Negative for dizziness.  Endo/Heme/Allergies: Does not bruise/bleed easily.  Psychiatric/Behavioral: Positive for depression. Negative for suicidal ideas. The patient is nervous/anxious and has insomnia.       Allergies  Allergen Reactions  . Strawberry Flavor Anaphylaxis   Outpatient Medications Prior to Visit  Medication Sig Dispense Refill  . albuterol (PROVENTIL HFA;VENTOLIN HFA) 108 (90 Base) MCG/ACT inhaler Inhale 1-2 puffs into the lungs every 6 (six) hours as needed for wheezing or shortness of breath.    . busPIRone (BUSPAR) 10 MG tablet TAKE 1 TABLET BY MOUTH THREE TIMES DAILY 90 tablet 1  . Cholecalciferol (VITAMIN D) 50 MCG (2000 UT) tablet Take 2,000 Units by mouth 2 (two) times daily.    . cyclobenzaprine (FLEXERIL) 10 MG tablet Take 1 tablet (10 mg total) by mouth 3 (three) times daily. 90 tablet 1  . dexamethasone 0.5 MG/5ML elixir Rinse with 5 mL for 2 minutes and then spit out. Perform 4 times daily until lesions resolve. 100 mL 0  . ferrous sulfate 325 (65 FE) MG tablet Take 325 mg by mouth 3 (three) times daily.    . fluticasone (FLOVENT HFA) 220 MCG/ACT inhaler Inhale 2 puffs into the lungs 2 (two) times daily.    Marland Kitchen. gabapentin (NEURONTIN) 800 MG tablet     . hydrocortisone cream 0.5 % Apply 1 application topically 2 (two) times daily as needed for itching. 30 g 1  . hydrOXYzine (ATARAX/VISTARIL) 25 MG tablet Take 1 tablet (25 mg total) by mouth 3 (three) times daily as needed. 30 tablet 0  . hyoscyamine (LEVSIN SL) 0.125 MG SL tablet Place 1 tablet (0.125 mg total) under the tongue every 4 (four) hours as needed for cramping. 30 tablet 0  . lidocaine (LMX) 4 % cream Apply 1 application topically daily as needed (skin). (Patient not taking: Reported on 09/24/2019) 30 g 0  . ondansetron (ZOFRAN-ODT) 4  MG disintegrating tablet Take 4 mg by mouth every 8 (eight) hours as needed for nausea or vomiting.    . polyethylene glycol powder (GLYCOLAX/MIRALAX) powder      No facility-administered medications prior to visit.      Patient Active Problem List   Diagnosis Date Noted  . Seborrhea capitis in adult 04/01/2019  . Gluten free diet 12/04/2018  . Anxiety state 02/13/2018  . Anxiety disorder due to medical condition 01/09/2018  . Irritable bowel syndrome with both constipation and diarrhea 08/15/2017  . Rash of face 08/15/2017  . Anemia due to chronic blood loss 05/02/2017  . Chronic pain syndrome 08/20/2016  . Iron deficiency anemia 08/20/2016  . Menorrhagia 08/20/2016  . Polyarthralgia   . Sinus tachycardia 08/16/2016  . 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 Back pain 08/16/2016  . Chest pain 08/16/2016    Past Medical History:  Reviewed and updated?  yes Past Medical History:  Diagnosis Date  . Arthritis   . Behcet's syndrome (HCC)   .  Costochondritis   . Fibromyalgia   . Headache   . Lyme disease     Family History: Reviewed and updated? yes Family History  Problem Relation Age of Onset  . Graves' disease Mother   . Turner syndrome Sister   . Diabetes Paternal Aunt   . Aneurysm Paternal Uncle   . Diabetes Paternal Grandmother     The following portions of the patient's history were reviewed and updated as appropriate: allergies, current medications, past family history, past medical history, past social history, past surgical history and problem list.  Visual Observations/Objective:   General Appearance: Well nourished well developed, in no apparent distress.  Eyes: conjunctiva no swelling or erythema ENT/Mouth: No hoarseness, No cough for duration of visit.  Neck: Supple  Respiratory: Respiratory effort normal, normal rate, no retractions or distress.   Cardio: Appears well-perfused, noncyanotic Musculoskeletal: no obvious deformity Skin: visible skin without  rashes, ecchymosis, erythema Neuro: Awake and oriented X 3,  Psych:  normal affect, Insight and Judgment appropriate.    Assessment/Plan: 1. Chronic midline low back pain without sciatica Will try medrol dosepak for increasing joint pain and weakness. Ultimately pain management is working to improve pain.   2. Chronic pain syndrome As above.  - methylPREDNISolone (MEDROL DOSEPAK) 4 MG TBPK tablet; Take according to package instructions  Dispense: 21 tablet; Refill: 0  3. Anxiety disorder due to medical condition Continues- contributes to overall condition I believe.   4. Menorrhagia with regular cycle Better controlled with IUD.   5. Polyarthralgia Review of records indicates she has seen peds rheum in the past. She was tried on humira for 6 weeks but then was d/c'ed when she was at [place]. She has f/u with rheum in the past, but difficult to see exactly what their plan was. Has 2 positive speckled ANA in 2014 that I was able to see. May benefit from a return visit.   6. Episode of altered consciousness  Discussed calling PCP and asking for neuro referral in her area in [place].    I discussed the assessment and treatment plan with the patient and/or parent/guardian.  They were provided an opportunity to ask questions and all were answered.  They agreed with the plan and demonstrated an understanding of the instructions. They were advised to call back or seek an in-person evaluation in the emergency room if the symptoms worsen or if the condition fails to improve as anticipated.   Follow-up:  After neuro and pain mgmt visits   Medical decision-making:   I spent 25 minutes on this telehealth visit inclusive of face-to-face video and care coordination time I was located off site during this encounter.   Alfonso Ramus, FNP    CC: System, Pcp Not In, No ref. provider found

## 2019-10-02 ENCOUNTER — Ambulatory Visit (INDEPENDENT_AMBULATORY_CARE_PROVIDER_SITE_OTHER): Admitting: Licensed Clinical Social Worker

## 2019-10-02 DIAGNOSIS — F064 Anxiety disorder due to known physiological condition: Secondary | ICD-10-CM

## 2019-10-02 DIAGNOSIS — G894 Chronic pain syndrome: Secondary | ICD-10-CM | POA: Diagnosis not present

## 2019-10-02 NOTE — BH Specialist Note (Signed)
Integrated Behavioral Health via Telemedicine Video Visit  10/02/2019 Susan George [id number]  Number of [place] visits: 21 Session Start time: 4:22  Session End time: 5:04 Total time: 70  Referring Provider: Victorino Dike, NP Type of Visit: Video Patient/Family location: Home [facility] Provider location: [facility] All persons participating in visit: Pt and [facility]  Confirmed patient's address: Yes  Confirmed patient's phone number: Yes  Any changes to demographics: No   Confirmed patient's insurance: Yes  Any changes to patient's insurance: No   Discussed confidentiality: Yes   I connected with Susan George by a video enabled telemedicine application and verified that I am speaking with the correct person using two identifiers.     I discussed the limitations of evaluation and management by telemedicine and the availability of in person appointments.  I discussed that the purpose of this visit is to provide behavioral health care while limiting exposure to the novel coronavirus.   Discussed there is a possibility of technology failure and discussed alternative modes of communication if that failure occurs.  I discussed that engaging in this video visit, they consent to the provision of behavioral healthcare and the services will be billed under their insurance.  Patient and/or legal guardian expressed understanding and consented to video visit: Yes   PRESENTING CONCERNS: Patient and/or family reports the following symptoms/concerns: Pt reports ongoing frustrations related to chronic illness. Pt also reports having taken initiative and advocating for her needs in regards to health and wellness. Pt has been able to get out of her comfort zone and speak up for herself. Duration of problem: ongoing; Severity of problem: severe  STRENGTHS (Protective Factors/Coping Skills): Pt has supportive family Pt w/ hx of counseling relationships  GOALS ADDRESSED: Patient will: 1.   Reduce symptoms of: anxiety and stress  2.  Increase knowledge and/or ability of: coping skills, healthy habits and stress reduction  3.  Demonstrate ability to: Increase healthy adjustment to current life circumstances and Increase adequate support systems for patient/family  INTERVENTIONS: Interventions utilized:  Solution-Focused Strategies and Supportive Counseling Standardized Assessments completed: Not Needed  ASSESSMENT: Patient currently experiencing ongoing symptoms of stress and feelings of low self-worth. Pt experiencing chronic illness that is impacting her well-being in several aspects of her life. Pt now experiencing taking steps toward taking er healthcare into her own hands.   Patient may benefit from ongoing support as well as ongoing assertiveness.  PLAN: 1. Follow up with behavioral health clinician on : 10/16/2019 2. Behavioral recommendations: Pt will keep appts w/ specialists, will advocate for her needs in the workplace 3. Referral(s): [place] (In Clinic)  I discussed the assessment and treatment plan with the patient and/or parent/guardian. They were provided an opportunity to ask questions and all were answered. They agreed with the plan and demonstrated an understanding of the instructions.   They were advised to call back or seek an in-person evaluation if the symptoms worsen or if the condition fails to improve as anticipated.  Susan George

## 2019-10-16 ENCOUNTER — Ambulatory Visit (INDEPENDENT_AMBULATORY_CARE_PROVIDER_SITE_OTHER): Payer: BC Managed Care – PPO | Admitting: Licensed Clinical Social Worker

## 2019-10-16 DIAGNOSIS — G894 Chronic pain syndrome: Secondary | ICD-10-CM | POA: Diagnosis not present

## 2019-10-16 DIAGNOSIS — F064 Anxiety disorder due to known physiological condition: Secondary | ICD-10-CM | POA: Diagnosis not present

## 2019-10-16 NOTE — BH Specialist Note (Signed)
Integrated Behavioral Health via Telemedicine Video Visit  10/16/2019 Susan George [id number]  Number of [place] visits: 20 Session Start time: 4:40  Session End time: 5:15 Total time: 35   Referring Provider: Victorino Dike, NP Type of Visit: Video Patient/Family location: Home [facility] Provider location: [facility] All persons participating in visit: Pt, [facility]  Confirmed patient's address: Yes  Confirmed patient's phone number: Yes  Any changes to demographics: No   Confirmed patient's insurance: Yes  Any changes to patient's insurance: No   Discussed confidentiality: Yes   I connected with Susan George by a video enabled telemedicine application and verified that I am speaking with the correct person using two identifiers.     I discussed the limitations of evaluation and management by telemedicine and the availability of in person appointments.  I discussed that the purpose of this visit is to provide behavioral health care while limiting exposure to the novel coronavirus.   Discussed there is a possibility of technology failure and discussed alternative modes of communication if that failure occurs.  I discussed that engaging in this video visit, they consent to the provision of behavioral healthcare and the services will be billed under their insurance.  Patient and/or legal guardian expressed understanding and consented to video visit: Yes   PRESENTING CONCERNS: Patient and/or family reports the following symptoms/concerns: Pt reports ongoing anxiety and frustration related to her chronic medical condition. Pt reports ongoing pain and fatigue. Pt also reports having been able to make several appointments with specialists for herself. Pt reports feeling more able to speak for herself and ask for what she needs. Pt also reports feeling successful in her job. Duration of problem: ongoing pain, depression, and anxiety; Severity of problem: severe  STRENGTHS  (Protective Factors/Coping Skills): Pt w/ hx of counseling relationships Pt able to ask for what she needs Pt w/ supportive family  GOALS ADDRESSED: Patient will: 1.  Demonstrate ability to: Increase healthy adjustment to current life circumstances and Increase adequate support systems for patient/family  INTERVENTIONS: Interventions utilized:  Behavioral Activation, Brief CBT, Supportive Counseling and Psychoeducation and/or Health Education Standardized Assessments completed: Not Needed  ASSESSMENT: Patient currently experiencing ongoing symptoms of stress, anxiety, and depression, as related to chronic medical condition. Pt experiencing being able to advocate for self and create appointments for healthcare as needed.   Patient may benefit from ongoing support as well as ongoing assertiveness. Pt may also benefit from psychiatric support.  PLAN: 1. Follow up with behavioral health clinician on : 10/23/2019 2. Behavioral recommendations: Pt will keep upcoming appts, will call primary care office and request a new PCP 3. Referral(s): [place] (In Clinic)  I discussed the assessment and treatment plan with the patient and/or parent/guardian. They were provided an opportunity to ask questions and all were answered. They agreed with the plan and demonstrated an understanding of the instructions.   They were advised to call back or seek an in-person evaluation if the symptoms worsen or if the condition fails to improve as anticipated.  [name]

## 2019-10-23 ENCOUNTER — Ambulatory Visit (INDEPENDENT_AMBULATORY_CARE_PROVIDER_SITE_OTHER): Payer: BC Managed Care – PPO | Admitting: Licensed Clinical Social Worker

## 2019-10-23 DIAGNOSIS — F064 Anxiety disorder due to known physiological condition: Secondary | ICD-10-CM

## 2019-10-23 DIAGNOSIS — G894 Chronic pain syndrome: Secondary | ICD-10-CM | POA: Diagnosis not present

## 2019-10-24 NOTE — BH Specialist Note (Signed)
Integrated Behavioral Health via Telemedicine Video Visit  10/23/2019 Susan George [id number]  Number of Susan [place] visits: 4 Session Start time: 5:13  Session End time: 5:40 Total time: 4  Referring Provider: Victorino Dike, NP Type of Visit: Video Patient/Family location: Home [facility] Provider location: Susan George All persons participating in visit: Pt and Susan George  Confirmed patient's address: Yes  Confirmed patient's phone number: Yes  Any changes to demographics: No   Confirmed patient's insurance: Yes  Any changes to patient's insurance: No   Discussed confidentiality: Yes   I connected with Susan George by a video enabled telemedicine application and verified that I am speaking with the correct person using two identifiers.     I discussed the limitations of evaluation and management by telemedicine and the availability of in person appointments.  I discussed that the purpose of this visit is to provide behavioral health care while limiting exposure to the novel coronavirus.   Discussed there is a possibility of technology failure and discussed alternative modes of communication if that failure occurs.  I discussed that engaging in this video visit, they consent to the provision of behavioral healthcare and the services will be billed under their insurance.  Patient and/or legal guardian expressed understanding and consented to video visit: Yes   PRESENTING CONCERNS: Patient and/or family reports the following symptoms/concerns: Pt reports ongoing stressors associated with her chronic medical condition. Pt reports feeling like she is not taken seriously or heard when she asks for what she needs. Pt reports ongoing frustration related to medical care. Pt also reports stress and frustration w/ changes to her job, as Susan George continues. Duration of problem: years; Severity of problem: severe  STRENGTHS (Protective Factors/Coping Skills): Pt w/ supportive  family Pt willing to advocate for herself Pt interested in taking control of more of her health care  GOALS ADDRESSED: Patient will: 1.  Demonstrate ability to: Increase healthy adjustment to current life circumstances and Increase adequate support systems for patient/family  INTERVENTIONS: Interventions utilized:  Solution-Focused Strategies, Behavioral Activation and Supportive Counseling Standardized Assessments completed: Not Needed  ASSESSMENT: Patient currently experiencing symptoms of stress, anxiety, and depression ,as related to chronic medical condition.   Patient may benefit from ongoing support as well as increased responsibility in taking care of medical needs. Pt may also benefit from psychiatric support.  PLAN: 1. Follow up with behavioral health clinician on : 10/30/2019 2. Behavioral recommendations: Pt will continue to call primary care office to request a new PCP, pt will also call insurance referral coordinator for referral for psychiatry. 3. Referral(s): [place] (In [place]) and Psychiatrist  I discussed the assessment and treatment plan with the patient and/or parent/guardian. They were provided an opportunity to ask questions and all were answered. They agreed with the plan and demonstrated an understanding of the instructions.   They were advised to call back or seek an in-person evaluation if the symptoms worsen or if the condition fails to improve as anticipated.  Susan George

## 2019-10-30 ENCOUNTER — Encounter: Admitting: Licensed Clinical Social Worker

## 2019-10-30 NOTE — BH Specialist Note (Signed)
Chart opened for pre-charting, pt emailed to reschedule. Encounter closed for admin purposes, appt rescheduled for 10/31/2019.

## 2019-10-31 ENCOUNTER — Ambulatory Visit (INDEPENDENT_AMBULATORY_CARE_PROVIDER_SITE_OTHER): Payer: BC Managed Care – PPO | Admitting: Licensed Clinical Social Worker

## 2019-10-31 DIAGNOSIS — F064 Anxiety disorder due to known physiological condition: Secondary | ICD-10-CM

## 2019-10-31 NOTE — BH Specialist Note (Signed)
Integrated Behavioral Health via Telemedicine Video Visit  10/31/2019 Susan George [id number]  Number of [place] visits: 24 Session Start time: 5:00  Session End time: 5:30 Total time: 30  Referring Provider: Victorino Dike, NP Type of Visit: Video Patient/Family location: Home [facility] Provider location: [facility] All persons participating in visit: Pt and [facility]  Confirmed patient's address: Yes  Confirmed patient's phone number: Yes  Any changes to demographics: No   Confirmed patient's insurance: Yes  Any changes to patient's insurance: No   Discussed confidentiality: Yes   I connected with Susan George by a video enabled telemedicine application and verified that I am speaking with the correct person using two identifiers.     I discussed the limitations of evaluation and management by telemedicine and the availability of in person appointments.  I discussed that the purpose of this visit is to provide behavioral health care while limiting exposure to the novel coronavirus.   Discussed there is a possibility of technology failure and discussed alternative modes of communication if that failure occurs.  I discussed that engaging in this video visit, they consent to the provision of behavioral healthcare and the services will be billed under their insurance.  Patient and/or legal guardian expressed understanding and consented to video visit: Yes   PRESENTING CONCERNS: Patient and/or family reports the following symptoms/concerns: Pt continues to express frustration with feeling like she is not heard in the medical setting, as well as her job.  Duration of problem: years; Severity of problem: severe  STRENGTHS (Protective Factors/Coping Skills): Pt more actively taking charge of her medical care Pt able to overcome discomfort  GOALS ADDRESSED: Patient will: 1.  Demonstrate ability to: Increase healthy adjustment to current life circumstances and Increase adequate  support systems for patient/family  INTERVENTIONS: Interventions utilized:  Brief CBT and Supportive Counseling Standardized Assessments completed: Not Needed  ASSESSMENT: Patient currently experiencing ongoing symptoms of stress, anxiety, and depression, as evidenced by pt's report.   Patient may benefit from ongoing support form this clinic as well as increased involvement in medical care. Pt may also benefit from psychiatric support.  PLAN: 1. Follow up with behavioral health clinician on : 11/11/2019 2. Behavioral recommendations: Pt will consider success at the end of the day, [facility] and Pt will follow up w/ anxiety of being without. 3. Referral(s): [place] (In Clinic)  I discussed the assessment and treatment plan with the patient and/or parent/guardian. They were provided an opportunity to ask questions and all were answered. They agreed with the plan and demonstrated an understanding of the instructions.   They were advised to call back or seek an in-person evaluation if the symptoms worsen or if the condition fails to improve as anticipated.  Susan George

## 2019-11-11 ENCOUNTER — Other Ambulatory Visit: Payer: Self-pay

## 2019-11-11 ENCOUNTER — Ambulatory Visit (INDEPENDENT_AMBULATORY_CARE_PROVIDER_SITE_OTHER): Payer: BC Managed Care – PPO | Admitting: Licensed Clinical Social Worker

## 2019-11-11 DIAGNOSIS — F064 Anxiety disorder due to known physiological condition: Secondary | ICD-10-CM | POA: Diagnosis not present

## 2019-11-11 NOTE — BH Specialist Note (Signed)
Integrated Behavioral Health via Telemedicine Video Visit  11/11/2019 Susan George [id number]  Number of [place] visits: 21 Session Start time: 5:11  Session End time: 5:42 Total time: 31  Referring Provider: [place] Type of Visit: Video Patient/Family location: Home [facility] Provider location: [facility] All persons participating in visit: Pt and [place]  Confirmed patient's address: Yes  Confirmed patient's phone number: Yes  Any changes to demographics: No   Confirmed patient's insurance: Yes  Any changes to patient's insurance: No   Discussed confidentiality: Yes   I connected with Susan George by a video enabled telemedicine application and verified that I am speaking with the correct person using two identifiers.     I discussed the limitations of evaluation and management by telemedicine and the availability of in person appointments.  I discussed that the purpose of this visit is to provide behavioral health care while limiting exposure to the novel coronavirus.   Discussed there is a possibility of technology failure and discussed alternative modes of communication if that failure occurs.  I discussed that engaging in this video visit, they consent to the provision of behavioral healthcare and the services will be billed under their insurance.  Patient and/or legal guardian expressed understanding and consented to video visit: Yes   PRESENTING CONCERNS: Patient and/or family reports the following symptoms/concerns: Pt continues to report stress, anxiety, frustration, and depression related to ongoing medical concerns. Pt reports increasing frustrations with the difficulty of navigating the healthcare system. Pt reports feeling like she is not listened to or taken seriously. Duration of problem: years; Severity of problem: severe  STRENGTHS (Protective Factors/Coping Skills): Pt able to engage in tasks despite feeling uncomfortable Pt is taking more active  charge of her medical care  GOALS ADDRESSED: Patient will: 1.  Demonstrate ability to: Increase healthy adjustment to current life circumstances and Increase adequate support systems for patient/family  INTERVENTIONS: Interventions utilized:  Behavioral Activation, Brief CBT and Supportive Counseling Standardized Assessments completed: Not Needed  ASSESSMENT: Patient currently experiencing ongoing symptoms of stress, anxiety, and depression, as evidenced by pt's report.   Patient may benefit from ongoing support from this clinic as well as increased involvement in medical care. Pt may also benefit from psychiatric support.  PLAN: 1. Follow up with behavioral health clinician on : 11/18/2019 2. Behavioral recommendations: Pt will go onto insurance specialist request portal and request a psychiatrist 3. Referral(s): [place] (In Clinic), [place] (LME/Outside Clinic) and [facility]  I discussed the assessment and treatment plan with the patient and/or parent/guardian. They were provided an opportunity to ask questions and all were answered. They agreed with the plan and demonstrated an understanding of the instructions.   They were advised to call back or seek an in-person evaluation if the symptoms worsen or if the condition fails to improve as anticipated.  [name]

## 2019-11-18 ENCOUNTER — Ambulatory Visit (INDEPENDENT_AMBULATORY_CARE_PROVIDER_SITE_OTHER): Payer: BC Managed Care – PPO | Admitting: Licensed Clinical Social Worker

## 2019-11-18 DIAGNOSIS — F064 Anxiety disorder due to known physiological condition: Secondary | ICD-10-CM

## 2019-11-18 NOTE — BH Specialist Note (Signed)
Integrated Behavioral Health via Telemedicine Video Visit  11/18/2019 Susan George [id number]  Number of [place] visits: 33 Session Start time: 5:00  Session End time: 5:32 Total time: 32  Referring Provider: Dr. Henrene Pastor Type of Visit: Video Patient/Family location: Home [facility] Provider location: [place] persons participating in visit: Pt and [facility]  Confirmed patient's address: Yes  Confirmed patient's phone number: Yes  Any changes to demographics: No   Confirmed patient's insurance: Yes  Any changes to patient's insurance: No   Discussed confidentiality: Yes   I connected with Susan George by a video enabled telemedicine application and verified that I am speaking with the correct person using two identifiers.     I discussed the limitations of evaluation and management by telemedicine and the availability of in person appointments.  I discussed that the purpose of this visit is to provide behavioral health care while limiting exposure to the novel coronavirus.   Discussed there is a possibility of technology failure and discussed alternative modes of communication if that failure occurs.  I discussed that engaging in this video visit, they consent to the provision of behavioral healthcare and the services will be billed under their insurance.  Patient and/or legal guardian expressed understanding and consented to video visit: Yes   PRESENTING CONCERNS: Patient and/or family reports the following symptoms/concerns: Pt reports ongoing symptoms of stress, anxiety, and depression. Pt reports feeling less stressed now that the classes she was taking have finished. Pt reports feeling like she is falling behind her peers, and feeling like she is not living up to expectations. Pt reports ongoing medical concerns, has been able to take more control of her appointments. Pt reports having been able to schedule an appt w/ a psychiatrist in February, will have an eval  done in December. Duration of problem: years; Severity of problem: severe  STRENGTHS (Protective Factors/Coping Skills): Pt taking more active role in medical care  GOALS ADDRESSED: Patient will: 1.  Reduce symptoms of: anxiety, depression and stress  2.  Increase knowledge and/or ability of: coping skills, self-management skills and stress reduction  3.  Demonstrate ability to: Increase healthy adjustment to current life circumstances and Increase adequate support systems for patient/family  INTERVENTIONS: Interventions utilized:  Solution-Focused Strategies, Behavioral Activation and Supportive Counseling Standardized Assessments completed: Not Needed  ASSESSMENT: Patient currently experiencing ongoing symptoms of anxiety and depression. Pt experiencing chronic illness and ongoing medical care. Pt also experiencing low self-esteem and self-worth related to markers of success in life.  Patient may benefit from ongoing support from this clinic and her medical team.  PLAN: 1. Follow up with behavioral health clinician on : 12/03/2019 2. Behavioral recommendations: Pt will identify 3 emotions other than frustration 3. Referral(s): [place] (In Clinic)  I discussed the assessment and treatment plan with the patient and/or parent/guardian. They were provided an opportunity to ask questions and all were answered. They agreed with the plan and demonstrated an understanding of the instructions.   They were advised to call back or seek an in-person evaluation if the symptoms worsen or if the condition fails to improve as anticipated.  [name]

## 2019-12-03 ENCOUNTER — Ambulatory Visit (INDEPENDENT_AMBULATORY_CARE_PROVIDER_SITE_OTHER): Payer: BC Managed Care – PPO | Admitting: Licensed Clinical Social Worker

## 2019-12-03 DIAGNOSIS — F064 Anxiety disorder due to known physiological condition: Secondary | ICD-10-CM | POA: Diagnosis not present

## 2019-12-03 NOTE — BH Specialist Note (Signed)
Integrated Behavioral Health via Telemedicine Video Visit  12/03/2019 Susan George [id number]  Number of [place] visits: 55 Session Start time: 5:03  Session End time: 5:41 Total time: 32  Referring Provider: Dr. Henrene Pastor Type of Visit: Video Patient/Family location: Home [facility] Provider location: [facility] All persons participating in visit: Pt, Susan George, Susan George  Confirmed patient's address: Yes  Confirmed patient's phone number: Yes  Any changes to demographics: No   Confirmed patient's insurance: Yes  Any changes to patient's insurance: No   Discussed confidentiality: Yes   I connected with Susan George by a video enabled telemedicine application and verified that I am speaking with the correct person using two identifiers.     I discussed the limitations of evaluation and management by telemedicine and the availability of in person appointments.  I discussed that the purpose of this visit is to provide behavioral health care while limiting exposure to the novel coronavirus.   Discussed there is a possibility of technology failure and discussed alternative modes of communication if that failure occurs.  I discussed that engaging in this video visit, they consent to the provision of behavioral healthcare and the services will be billed under their insurance.  Patient and/or legal guardian expressed understanding and consented to video visit: Yes   PRESENTING CONCERNS: Patient and/or family reports the following symptoms/concerns: Pt reports ongoing stressors associated w/ health concerns, job difficulties, and increased Covid strain Duration of problem: years; Severity of problem: severe  STRENGTHS (Protective Factors/Coping Skills): Pt able to identify her frustrations  GOALS ADDRESSED: Patient will: 1.  Increase knowledge and/or ability of: coping skills  2.  Demonstrate ability to: Increase healthy adjustment to current life circumstances and  Increase adequate support systems for patient/family  INTERVENTIONS: Interventions utilized:  Brief CBT, Supportive Counseling and Psychoeducation and/or Health Education Standardized Assessments completed: Not Needed  ASSESSMENT: Patient currently experiencing ongoing stressors, anxiety, and depression. Pt also experiencing a chronic medical condition which affects the way she interacts in the world. Pt experiencing ongoing low self-esteem.   Patient may benefit from ongoing support from this clinic and her medical team.  PLAN: 1. Follow up with behavioral health clinician on : 12/10/2019 2. Behavioral recommendations: Pt will maintain appts w/ specialists 3. Referral(s): [place] (In Clinic)  I discussed the assessment and treatment plan with the patient and/or parent/guardian. They were provided an opportunity to ask questions and all were answered. They agreed with the plan and demonstrated an understanding of the instructions.   They were advised to call back or seek an in-person evaluation if the symptoms worsen or if the condition fails to improve as anticipated.  [name]

## 2019-12-10 ENCOUNTER — Ambulatory Visit (INDEPENDENT_AMBULATORY_CARE_PROVIDER_SITE_OTHER): Payer: BC Managed Care – PPO | Admitting: Licensed Clinical Social Worker

## 2019-12-10 DIAGNOSIS — F064 Anxiety disorder due to known physiological condition: Secondary | ICD-10-CM | POA: Diagnosis not present

## 2019-12-10 DIAGNOSIS — G894 Chronic pain syndrome: Secondary | ICD-10-CM

## 2019-12-10 NOTE — BH Specialist Note (Signed)
Integrated Behavioral Health via Telemedicine Video Visit  12/10/2019 Susan George [id number]  Number of [place] visits: 60 Session Start time: 4:57  Session End time: 5:35 Total time: 50  Referring Provider: Red Pod Type of Visit: Video Patient/Family location: Home [facility] Provider location: [facility] All persons participating in visit: Pt, [place] intern Manfred Shirts, [facility]  Confirmed patient's address: Yes  Confirmed patient's phone number: Yes  Any changes to demographics: No   Confirmed patient's insurance: Yes  Any changes to patient's insurance: No   Discussed confidentiality: Yes   I connected with Susan George by a video enabled telemedicine application and verified that I am speaking with the correct person using two identifiers.     I discussed the limitations of evaluation and management by telemedicine and the availability of in person appointments.  I discussed that the purpose of this visit is to provide behavioral health care while limiting exposure to the novel coronavirus.   Discussed there is a possibility of technology failure and discussed alternative modes of communication if that failure occurs.  I discussed that engaging in this video visit, they consent to the provision of behavioral healthcare and the services will be billed under their insurance.  Patient and/or legal guardian expressed understanding and consented to video visit: Yes   PRESENTING CONCERNS: Patient and/or family reports the following symptoms/concerns: Pt reports ongoing stressors and frustrations w/ current life circumstances. Pt reports ongoing stress related to chronic medical condition. Pt feels anxious about the uncertainty related to the pandemic. Pt continues to question her effectiveness, and cites low self-esteem Duration of problem: years; Severity of problem: severe  STRENGTHS (Protective Factors/Coping Skills): Pt taking control of her medical appointments Pt able  to identify her frustrations  GOALS ADDRESSED: Patient will: 1.  Increase knowledge and/or ability of: coping skills and stress reduction  2.  Demonstrate ability to: Increase healthy adjustment to current life circumstances and Increase adequate support systems for patient/family  INTERVENTIONS: Interventions utilized:  Supportive Counseling and Psychoeducation and/or Health Education Standardized Assessments completed: Not Needed  ASSESSMENT: Patient currently experiencing ongoing stressors, anxiety, and depression, related to medical condition and current life circumstances.   Patient may benefit from ongoing support from this clinic and her medical team.  PLAN: 1. Follow up with behavioral health clinician on : 12/16/2019 2. Behavioral recommendations: Pt will watch ted talk video, and will keep upcoming appts w/ specialists 3. Referral(s): [place] (In Clinic)  I discussed the assessment and treatment plan with the patient and/or parent/guardian. They were provided an opportunity to ask questions and all were answered. They agreed with the plan and demonstrated an understanding of the instructions.   They were advised to call back or seek an in-person evaluation if the symptoms worsen or if the condition fails to improve as anticipated.  [name]

## 2019-12-16 ENCOUNTER — Ambulatory Visit (INDEPENDENT_AMBULATORY_CARE_PROVIDER_SITE_OTHER): Payer: BC Managed Care – PPO | Admitting: Licensed Clinical Social Worker

## 2019-12-16 DIAGNOSIS — F064 Anxiety disorder due to known physiological condition: Secondary | ICD-10-CM | POA: Diagnosis not present

## 2019-12-16 NOTE — BH Specialist Note (Signed)
Integrated Behavioral Health via Telemedicine Video Visit  12/16/2019 Mathilde Mcwherter [id number]  Number of [place] visits: 59 Session Start time: 1:41  Session End time: 2:14 Total time: 65  Referring Provider: Victorino Dike, NP Type of Visit: Video Patient/Family location: Home [facility] Provider location: Remote All persons participating in visit: Pt and [place]  Confirmed patient's address: Yes  Confirmed patient's phone number: Yes  Any changes to demographics: No   Confirmed patient's insurance: Yes  Any changes to patient's insurance: No   Discussed confidentiality: Yes   I connected with [name] by a video enabled telemedicine application and verified that I am speaking with the correct person using two identifiers.     I discussed the limitations of evaluation and management by telemedicine and the availability of in person appointments.  I discussed that the purpose of this visit is to provide behavioral health care while limiting exposure to the novel coronavirus.   Discussed there is a possibility of technology failure and discussed alternative modes of communication if that failure occurs.  I discussed that engaging in this video visit, they consent to the provision of behavioral healthcare and the services will be billed under their insurance.  Patient and/or legal guardian expressed understanding and consented to video visit: Yes   PRESENTING CONCERNS: Patient and/or family reports the following symptoms/concerns: Pt reports ongoing anxieties related to uncertainties caused by chronic health condition and Covid 19. Pt reports having had a psychiatric eval 12/16/2019, pt reports it is hard for her to identify or express her emotions. Pt reports ongoing trouble sleeping, finding it difficult to fall and stay asleep throughout the night Duration of problem: years; Severity of problem: severe  STRENGTHS (Protective Factors/Coping Skills): Pt taking more  control of her medical health appts  GOALS ADDRESSED: Patient will: 1.  Increase knowledge and/or ability of: coping skills and stress reduction  2.  Demonstrate ability to: Increase healthy adjustment to current life circumstances and Increase adequate support systems for patient/family  INTERVENTIONS: Interventions utilized:  Solution-Focused Strategies, Behavioral Activation, Supportive Counseling, Sleep Hygiene and Psychoeducation and/or Health Education Standardized Assessments completed: Not Needed  ASSESSMENT: Patient currently experiencing ongoing stressors, anxiety, and trouble sleeping, related to medical condition and current life circumstances.   Patient may benefit from ongoing support from her medical team.  PLAN: 1. Follow up with behavioral health clinician on : 12/23/2019 2. Behavioral recommendations: Pt will utilize guided meditation before bed 3. Referral(s): [place] (In Clinic) and Psychiatrist  I discussed the assessment and treatment plan with the patient and/or parent/guardian. They were provided an opportunity to ask questions and all were answered. They agreed with the plan and demonstrated an understanding of the instructions.   They were advised to call back or seek an in-person evaluation if the symptoms worsen or if the condition fails to improve as anticipated.  [name]

## 2019-12-22 IMAGING — US US THYROID
1 series · 14 of 25 positions shown · non-contrast
Comparison: None.

CLINICAL DATA: Palpable abnormality.  Thyromegaly on physical exam.

EXAM:
THYROID ULTRASOUND
TECHNIQUE: Ultrasound examination of the thyroid gland and adjacent soft
tissues was performed.

[Series 1: us thyroid · 0.05mm/px · 14 of 46 slices shown]
[im 1/46]
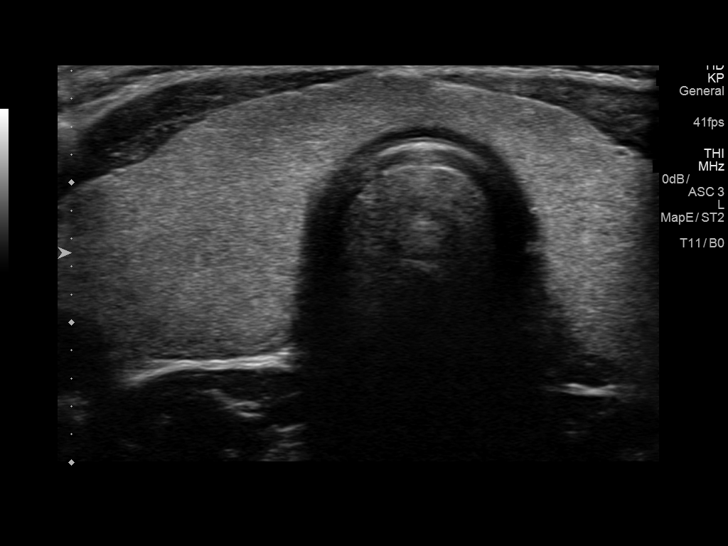
[im 4/46]
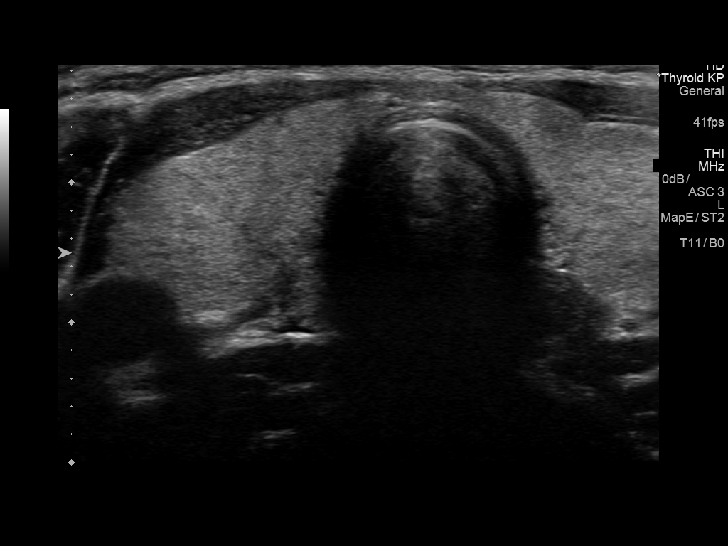
[im 8/46]
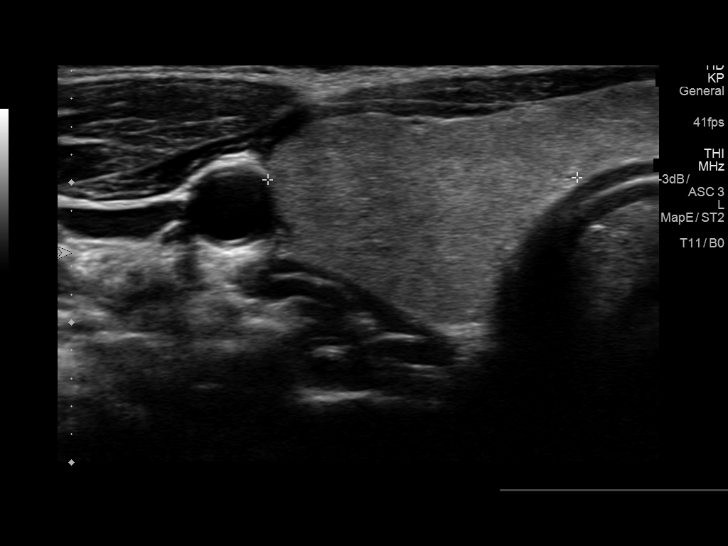
[im 12/46]
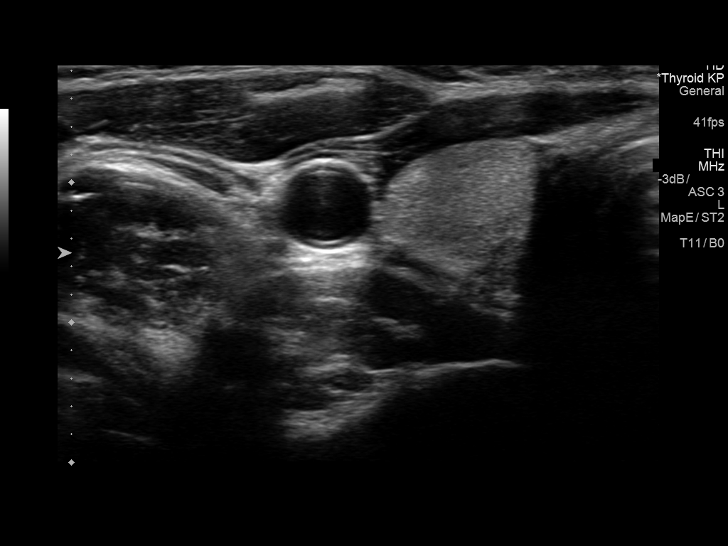
[im 16/46]
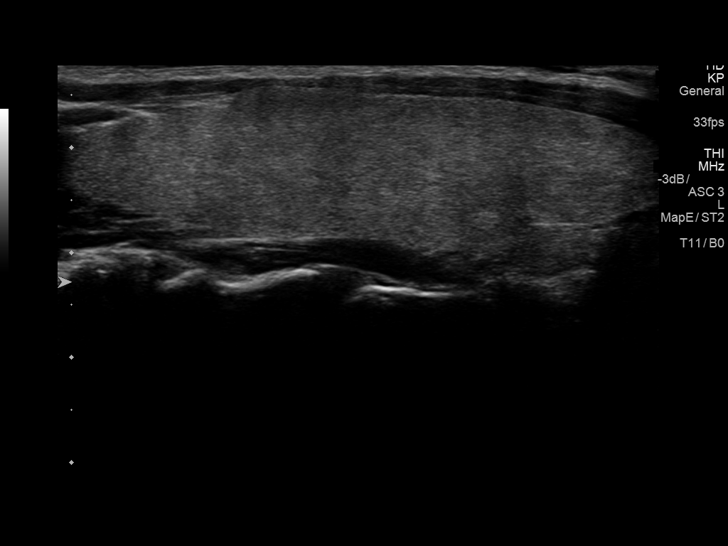
[im 17/46]
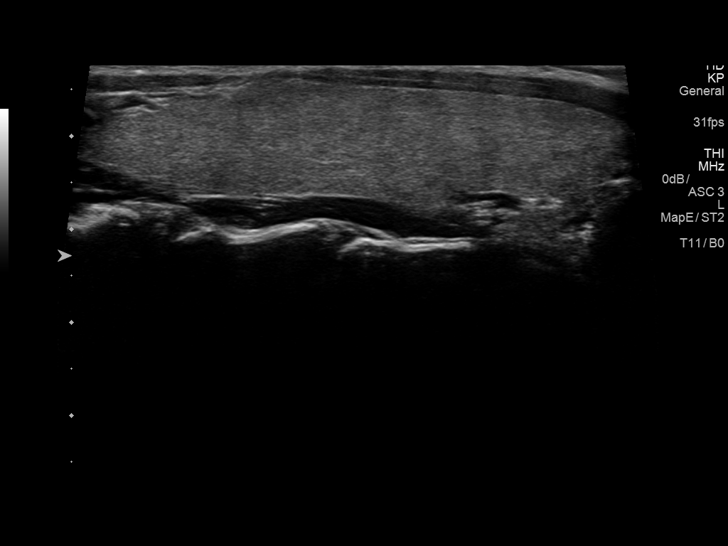
[im 21/46]
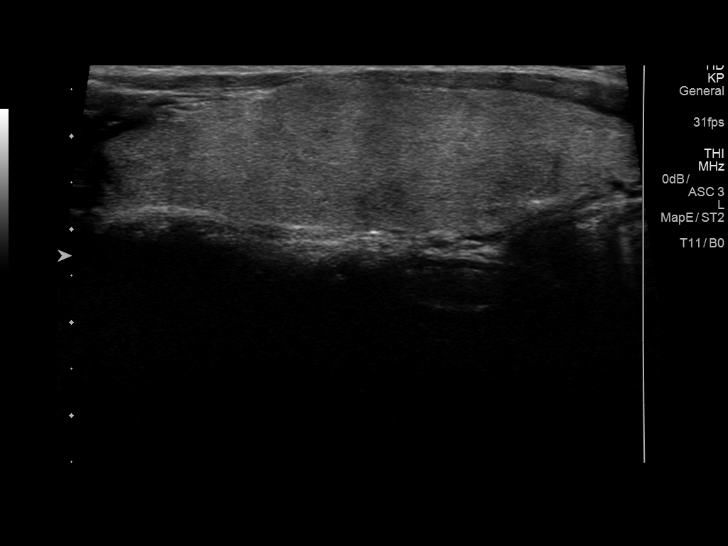
[im 25/46]
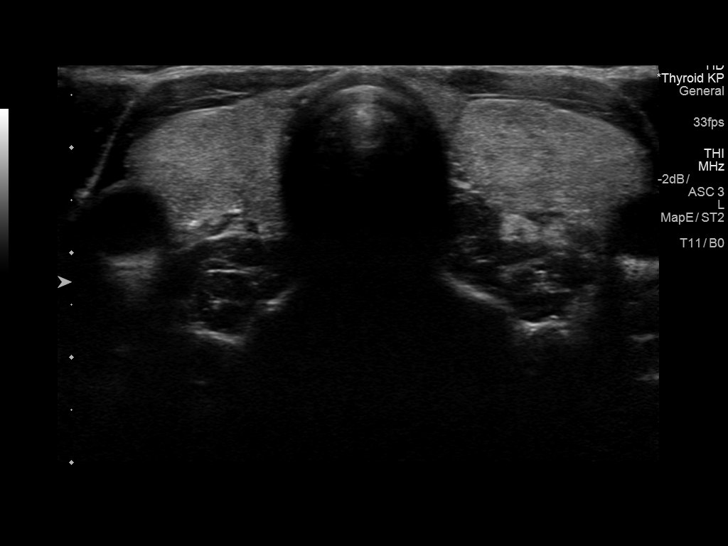
[im 29/46]
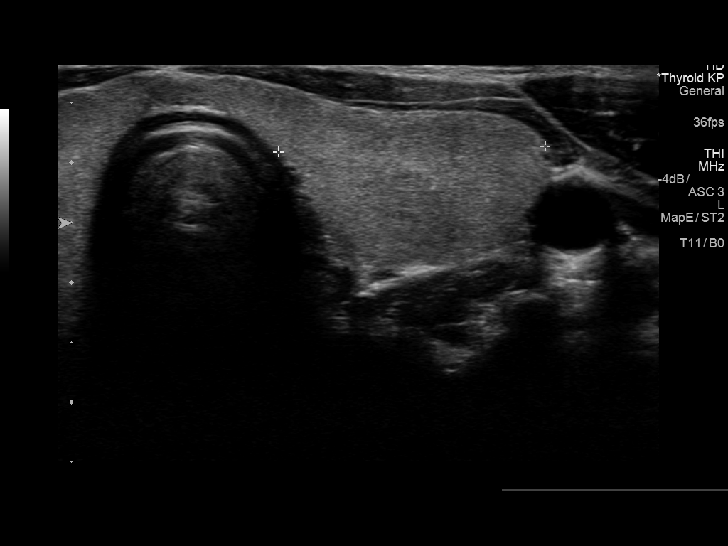
[im 31/46]
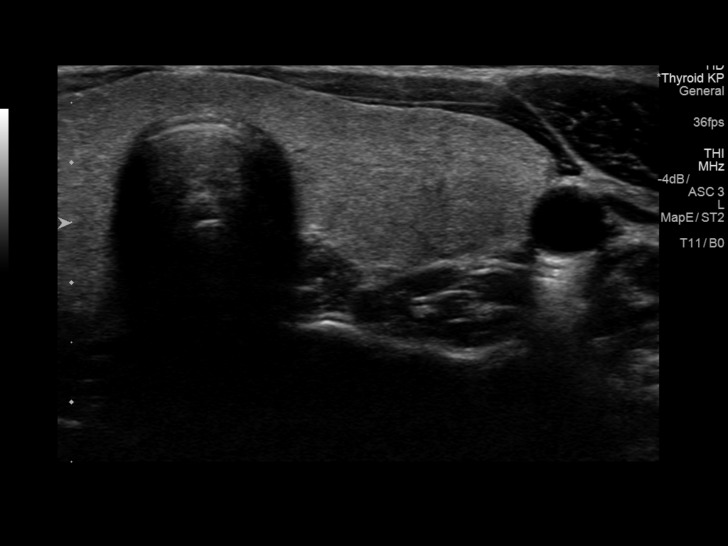
[im 34/46]
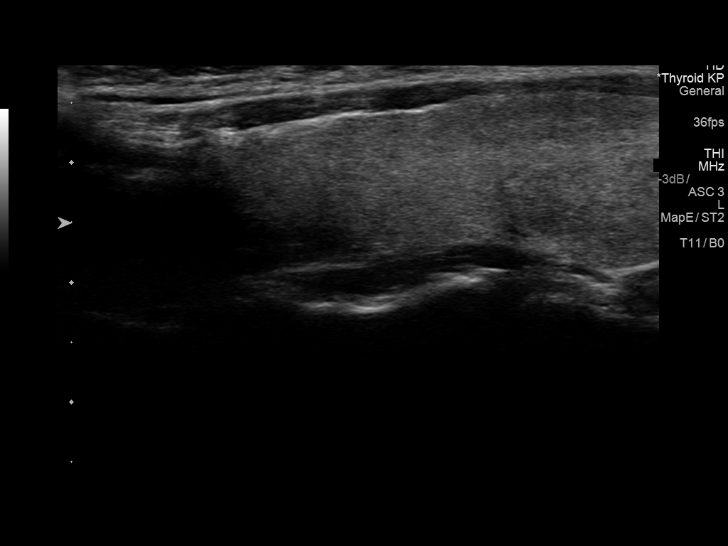
[im 38/46]
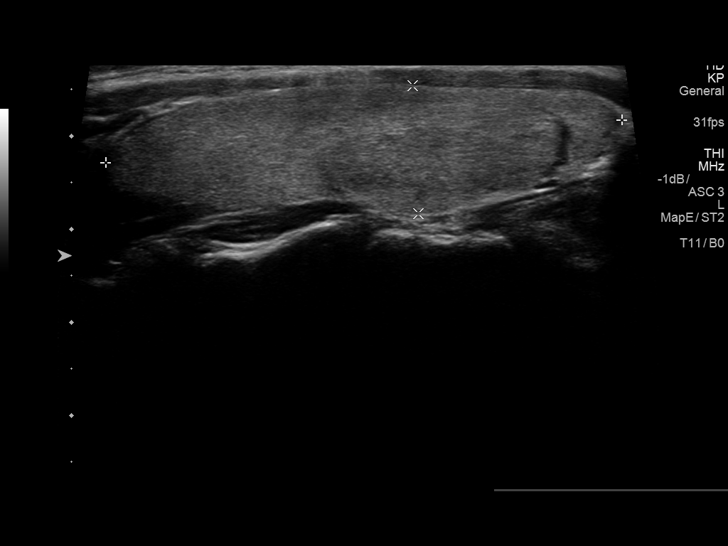
[im 42/46]
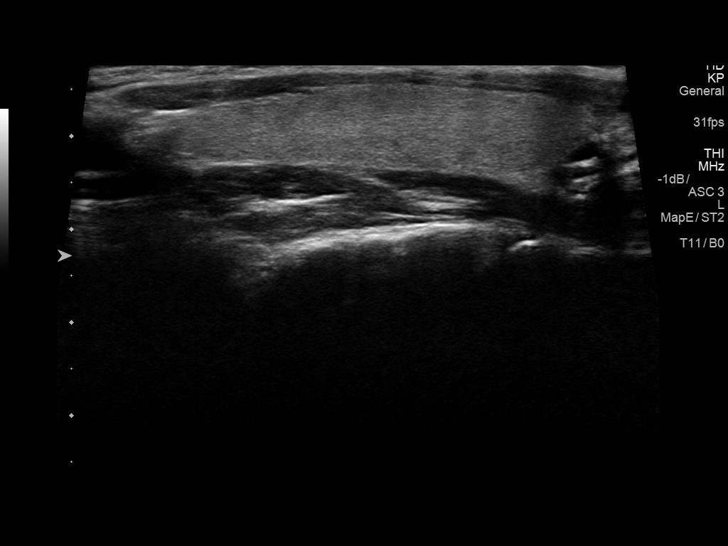
[im 46/46]
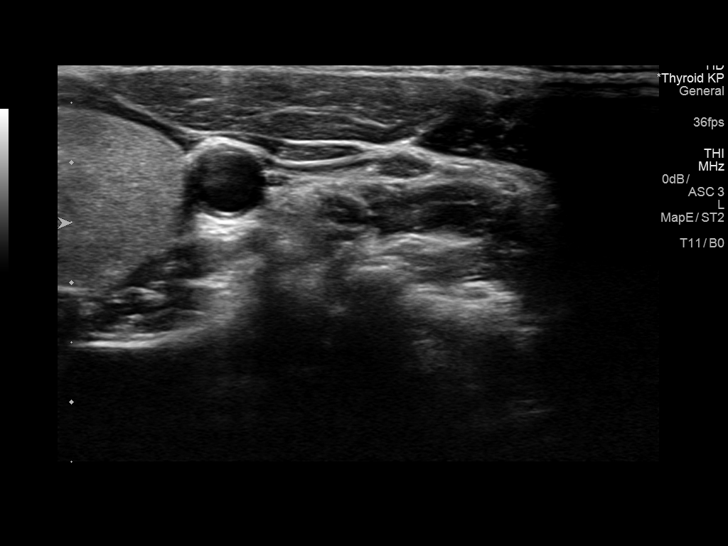

[14 of 25 positions shown; findings below may reference images not displayed]

FINDINGS: Parenchymal Echotexture: Normal

Isthmus: 0.4 cm

Right lobe: 6.1 x 1.3 x 2.2 cm

Left lobe: 5.6 x 1.4 x 2.2 cm

_________________________________________________________

Estimated total number of nodules >/= 1 cm: 0

Number of spongiform nodules >/=  2 cm not described below (TR1): 0

Number of mixed cystic and solid nodules >/= 1.5 cm not described
below (TR2): 0

_________________________________________________________

No discrete nodules are seen within the thyroid gland.
IMPRESSION: The thyroid gland is borderline enlarged. There are no discrete
nodules which meet criteria for biopsy nor follow-up.

The above is in keeping with the ACR TI-RADS recommendations - [HOSPITAL] 9504;[DATE].

## 2019-12-23 ENCOUNTER — Ambulatory Visit (INDEPENDENT_AMBULATORY_CARE_PROVIDER_SITE_OTHER): Payer: BC Managed Care – PPO | Admitting: Licensed Clinical Social Worker

## 2019-12-23 DIAGNOSIS — F064 Anxiety disorder due to known physiological condition: Secondary | ICD-10-CM

## 2019-12-23 NOTE — BH Specialist Note (Signed)
Integrated Behavioral Health via Telemedicine Video Visit  12/23/2019 Latasha Buczkowski [id number]  Number of [place] visits: 87 Session Start time: 12:00  Session End time: 12:33 Total time: 30  Referring Provider: Victorino Dike, NP Type of Visit: Video Patient/Family location: Home [facility] Provider location: [facility] All persons participating in visit: Pt and [facility]  Confirmed patient's address: Yes  Confirmed patient's phone number: Yes  Any changes to demographics: No   Confirmed patient's insurance: Yes  Any changes to patient's insurance: No   Discussed confidentiality: Yes   I connected with [name] by a video enabled telemedicine application and verified that I am speaking with the correct person using two identifiers.     I discussed the limitations of evaluation and management by telemedicine and the availability of in person appointments.  I discussed that the purpose of this visit is to provide behavioral health care while limiting exposure to the novel coronavirus.   Discussed there is a possibility of technology failure and discussed alternative modes of communication if that failure occurs.  I discussed that engaging in this video visit, they consent to the provision of behavioral healthcare and the services will be billed under their insurance.  Patient and/or legal guardian expressed understanding and consented to video visit: Yes   PRESENTING CONCERNS: Patient and/or family reports the following symptoms/concerns: Pt reports feeling consistently frustrated with her medical care and the ongoing difficulties associated with a chronic illness. Pt experiencing emotional struggles, trouble with self-care, and physical pains. Duration of problem: years; Severity of problem: severe  STRENGTHS (Protective Factors/Coping Skills): Pt taking charge of medical care  GOALS ADDRESSED: Patient will:  1.  Increase knowledge and/or ability of: coping skills and  stress reduction  2.  Demonstrate ability to: Increase healthy adjustment to current life circumstances and Increase adequate support systems for patient/family  INTERVENTIONS: Interventions utilized:  Solution-Focused Strategies, Behavioral Activation, Supportive Counseling and Psychoeducation and/or Health Education Standardized Assessments completed: Not Needed  ASSESSMENT: Patient currently experiencing ongoing mental and physical symptoms related to chronic illness. Pt experiencing low self-esteem and self-worth.   Patient may benefit from ongoing support from her medical team.  PLAN: 1. Follow up with behavioral health clinician on : 01/03/2020 2. Behavioral recommendations: Pt will make a conscious effort to change her self-talk; pt will watch brene brown ted talk 3. Referral(s): [place] (In Clinic)  I discussed the assessment and treatment plan with the patient and/or parent/guardian. They were provided an opportunity to ask questions and all were answered. They agreed with the plan and demonstrated an understanding of the instructions.   They were advised to call back or seek an in-person evaluation if the symptoms worsen or if the condition fails to improve as anticipated.  [name]

## 2020-01-03 ENCOUNTER — Ambulatory Visit (INDEPENDENT_AMBULATORY_CARE_PROVIDER_SITE_OTHER): Payer: BC Managed Care – PPO | Admitting: Licensed Clinical Social Worker

## 2020-01-03 DIAGNOSIS — F064 Anxiety disorder due to known physiological condition: Secondary | ICD-10-CM | POA: Diagnosis not present

## 2020-01-03 NOTE — BH Specialist Note (Signed)
Integrated Behavioral Health via Telemedicine Video Visit  01/03/2020 Susan George [id number]  Number of Integrated Behavioral Health visits: 31 Session Start time: 4:45  Session End time: 5:13 Total time: 28  Referring Provider: Candida Peeling, NP Type of Visit: Video Patient/Family location: Home [facility] Provider location: Remote All persons participating in visit: Pt and [facility]  Confirmed patient's address: Yes  Confirmed patient's phone number: Yes  Any changes to demographics: No   Confirmed patient's insurance: Yes  Any changes to patient's insurance: No   Discussed confidentiality: Yes   I connected with Susan George by a video enabled telemedicine application and verified that I am speaking with the correct person using two identifiers.     I discussed the limitations of evaluation and management by telemedicine and the availability of in person appointments.  I discussed that the purpose of this visit is to provide behavioral health care while limiting exposure to the novel coronavirus.   Discussed there is a possibility of technology failure and discussed alternative modes of communication if that failure occurs.  I discussed that engaging in this video visit, they consent to the provision of behavioral healthcare and the services will be billed under their insurance.  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 She also reports some stress about her own online classes beginning later this month. Pt is feeling uncertainty and frustration about current political environment. Pt has recently brought home a support dog, has enjoyed having the dog with her. Duration of problem: ongoing; Severity of problem: severe  STRENGTHS (Protective Factors/Coping Skills): Pt able  to identify feelings of frustration Pt w/ wrap-around health care team  GOALS ADDRESSED: Patient will: 1.  Demonstrate ability to: Increase healthy adjustment to current life circumstances  INTERVENTIONS: Interventions utilized:  Supportive Counseling and Psychoeducation and/or Health Education Standardized Assessments completed: Not Needed  ASSESSMENT: Patient currently experiencing ongoing anxiety and depression related to stressors around school, work, Lawyer, pandemic, and her own chronic health concerns.   Patient may benefit from ongoing support from her healthcare team.  PLAN: 1. Follow up with behavioral health clinician on : 01/08/20 2. Behavioral recommendations: Pt will continue to go to bed at the same time consistently 3. Referral(s): Integrated [employer] (In Clinic)  I discussed the assessment and treatment plan with the patient and/or parent/guardian. They were provided an opportunity to ask questions and all were answered. They agreed with the plan and demonstrated an understanding of the instructions.   They were advised to call back or seek an in-person evaluation if the symptoms worsen or if the condition fails to improve as anticipated.  [name]

## 2020-01-08 ENCOUNTER — Ambulatory Visit (INDEPENDENT_AMBULATORY_CARE_PROVIDER_SITE_OTHER): Payer: BC Managed Care – PPO | Admitting: Licensed Clinical Social Worker

## 2020-01-08 DIAGNOSIS — F064 Anxiety disorder due to known physiological condition: Secondary | ICD-10-CM

## 2020-01-08 DIAGNOSIS — G894 Chronic pain syndrome: Secondary | ICD-10-CM | POA: Diagnosis not present

## 2020-01-08 NOTE — BH Specialist Note (Signed)
Integrated Behavioral Health via Telemedicine Video Visit  01/08/2020 Susan George [id number]  Number of Integrated Behavioral Health visits: 32 Session Start time: 5:00  Session End time: 5:29 Total time: 29  Referring Provider: Candida Peeling, NP Type of Visit: Video Patient/Family location: Home [facility] Provider location: Remote All persons participating in visit: Pt and [facility]  Confirmed patient's address: Yes  Confirmed patient's phone number: Yes  Any changes to demographics: No   Confirmed patient's insurance: Yes  Any changes to patient's insurance: No   Discussed confidentiality: Yes   I connected with [name] by a video enabled telemedicine application and verified that I am speaking with the correct person using two identifiers.     I discussed the limitations of evaluation and management by telemedicine and the availability of in person appointments.  I discussed that the purpose of this visit is to provide behavioral health care while limiting exposure to the novel coronavirus.   Discussed there is a possibility of technology failure and discussed alternative modes of communication if that failure occurs.  I discussed that engaging in this video visit, they consent to the provision of behavioral healthcare and the services will be billed under their insurance.  Patient and/or legal guardian expressed understanding and consented to video visit: Yes   PRESENTING CONCERNS: Patient and/or family reports the following symptoms/concerns: Pt reports a slight decrease in anxiety associated with concerns about returning to the classroom. Pt reports having made decisions in order to advocate for herself and her medical well-being. Duration of problem: years; Severity of problem: severe  STRENGTHS (Protective Factors/Coping Skills): Pt able to identify feelings of frustration Pt more willing to advocate for herself  GOALS ADDRESSED: Patient will: 1.  Demonstrate ability to:  Increase healthy adjustment to current life circumstances  INTERVENTIONS: Interventions utilized:  Supportive Counseling and Psychoeducation and/or Health Education Standardized Assessments completed: Not Needed  ASSESSMENT: Patient currently experiencing ongoing anxiety and depression related to stressors around school, work, and chronic health condition.   Patient may benefit from ongoing support from her healthcare team.  PLAN: 1. Follow up with behavioral health clinician on : 01/22/2020 2. Behavioral recommendations: Pt will reach out to her PCP for a waiver to continue to work from home 3. Referral(s): Integrated [employer] (In Clinic)  I discussed the assessment and treatment plan with the patient and/or parent/guardian. They were provided an opportunity to ask questions and all were answered. They agreed with the plan and demonstrated an understanding of the instructions.   They were advised to call back or seek an in-person evaluation if the symptoms worsen or if the condition fails to improve as anticipated.  Susan George

## 2020-01-22 ENCOUNTER — Other Ambulatory Visit: Payer: Self-pay

## 2020-01-22 ENCOUNTER — Ambulatory Visit (INDEPENDENT_AMBULATORY_CARE_PROVIDER_SITE_OTHER): Payer: BC Managed Care – PPO | Admitting: Licensed Clinical Social Worker

## 2020-01-22 DIAGNOSIS — F064 Anxiety disorder due to known physiological condition: Secondary | ICD-10-CM | POA: Diagnosis not present

## 2020-01-22 DIAGNOSIS — G894 Chronic pain syndrome: Secondary | ICD-10-CM

## 2020-01-22 NOTE — BH Specialist Note (Signed)
Integrated Behavioral Health via Telemedicine Video Visit  01/22/2020 Susan George [id number]  Number of Integrated Behavioral Health visits: 33 Session Start time: 5:00  Session End time: 5:35 Total time: 35   Referring Provider: Candida Peeling, NP Type of Visit: Video Patient/Family location: Home [facility] Provider location: Remote All persons participating in visit: Pt and [facility]  Confirmed patient's address: Yes  Confirmed patient's phone number: Yes  Any changes to demographics: No   Confirmed patient's insurance: Yes  Any changes to patient's insurance: No   Discussed confidentiality: Yes   I connected with Susan George by a video enabled telemedicine application and verified that I am speaking with the correct person using two identifiers.     I discussed the limitations of evaluation and management by telemedicine and the availability of in person appointments.  I discussed that the purpose of this visit is to provide behavioral health care while limiting exposure to the novel coronavirus.   Discussed there is a possibility of technology failure and discussed alternative modes of communication if that failure occurs.  I discussed that engaging in this video visit, they consent to the provision of behavioral healthcare and the services will be billed under their insurance.  Patient and/or legal guardian expressed understanding and consented to video visit: Yes   PRESENTING CONCERNS: Patient and/or family reports the following symptoms/concerns: Pt reports feeling tired and overwhelmed by stressors this week. Pt reports continued pain related to chronic medical condition. Duration of problem: years; Severity of problem: severe  STRENGTHS (Protective Factors/Coping Skills): Pt has become more able to advocate for herself  GOALS ADDRESSED: Patient will: 1.  Demonstrate ability to: Increase healthy adjustment to current life circumstances  INTERVENTIONS: Interventions utilized:   Supportive Counseling Standardized Assessments completed: Not Needed  ASSESSMENT: Patient currently experiencing ongoing anxiety and depression related to current life circumstances and chromic medical condition.   Patient may benefit from ongoing support from this clinic as well as keeping appts w/ care team.  PLAN: 1. Follow up with behavioral health clinician on : 01/29/20 2. 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 Referral(s): Integrated [employer] (In Clinic)  I discussed the assessment and treatment plan with the patient and/or parent/guardian. They were provided an opportunity to ask questions and all were answered. They agreed with the plan and demonstrated an understanding of the instructions.   They were advised to call back or seek an in-person evaluation if the symptoms worsen or if the condition fails to improve as anticipated.  Susan George

## 2020-01-29 ENCOUNTER — Ambulatory Visit (INDEPENDENT_AMBULATORY_CARE_PROVIDER_SITE_OTHER): Payer: BC Managed Care – PPO | Admitting: Licensed Clinical Social Worker

## 2020-01-29 ENCOUNTER — Other Ambulatory Visit: Payer: Self-pay

## 2020-01-29 DIAGNOSIS — G894 Chronic pain syndrome: Secondary | ICD-10-CM

## 2020-01-29 DIAGNOSIS — G479 Sleep disorder, unspecified: Secondary | ICD-10-CM | POA: Diagnosis not present

## 2020-01-29 DIAGNOSIS — F064 Anxiety disorder due to known physiological condition: Secondary | ICD-10-CM

## 2020-01-29 NOTE — BH Specialist Note (Signed)
Integrated Behavioral Health via Telemedicine Video Visit  01/29/2020 Susan George [id number]  Number of Integrated Behavioral Health visits: 34 Session Start time: 5:30  Session End time: 5:57 Total time: 27  Referring Provider: Candida Peeling, NP Type of Visit: Video Patient/Family location: Home [facility] Provider location: Remote All persons participating in visit: Pt and [facility]  Confirmed patient's address: Yes  Confirmed patient's phone number: Yes  Any changes to demographics: No   Confirmed patient's insurance: Yes  Any changes to patient's insurance: No   Discussed confidentiality: Yes   I connected with [name] a video enabled telemedicine application and verified that I am speaking with the correct person using two identifiers.     I discussed the limitations of evaluation and management by telemedicine and the availability of in person appointments.  I discussed that the purpose of this visit is to provide behavioral health care while limiting exposure to the novel coronavirus.   Discussed there is a possibility of technology failure and discussed alternative modes of communication if that failure occurs.  I discussed that engaging in this video visit, they consent to the provision of behavioral healthcare and the services will be billed under their insurance.  Patient and/or legal guardian expressed understanding and consented to video visit: Yes   PRESENTING CONCERNS: Patient and/or family reports the following symptoms/concerns: Pt reports ongoing stress associated with chronic medical condition, as well as stress associated w/ work and school Duration of problem: years; Severity of problem: severe  STRENGTHS (Protective Factors/Coping Skills): Pt able to identify frustrations Pt advocating for needs more readily  GOALS ADDRESSED: Patient will: 1.  Demonstrate ability to: Increase healthy adjustment to current life circumstances and Increase adequate support systems  for patient/family  INTERVENTIONS: Interventions utilized:  Brief CBT and Supportive Counseling Standardized Assessments completed: Not Needed  ASSESSMENT: Patient currently experiencing continued stress, anxiety, and depression, associated w/ chronic illness.   Patient may benefit from ongoing support from this clinic, as well as continued support from specialists.  PLAN: 1. Follow up with behavioral health clinician on : 02/12/2020 2. Behavioral recommendations: Pt will try to reduce checking email while she is out sick from work 3. Referral(s): Integrated [employer] (In Clinic)  I discussed the assessment and treatment plan with the patient and/or parent/guardian. They were provided an opportunity to ask questions and all were answered. They agreed with the plan and demonstrated an understanding of the instructions.   They were advised to call back or seek an in-person evaluation if the symptoms worsen or if the condition fails to improve as anticipated.  Susan George

## 2020-02-12 ENCOUNTER — Ambulatory Visit (INDEPENDENT_AMBULATORY_CARE_PROVIDER_SITE_OTHER): Payer: BC Managed Care – PPO | Admitting: Licensed Clinical Social Worker

## 2020-02-12 DIAGNOSIS — G894 Chronic pain syndrome: Secondary | ICD-10-CM

## 2020-02-12 DIAGNOSIS — F064 Anxiety disorder due to known physiological condition: Secondary | ICD-10-CM

## 2020-02-12 DIAGNOSIS — G479 Sleep disorder, unspecified: Secondary | ICD-10-CM | POA: Diagnosis not present

## 2020-02-12 NOTE — BH Specialist Note (Signed)
Integrated Behavioral Health via Telemedicine Video Visit  02/12/2020 Susan George [id number]  Number of Integrated Behavioral Health visits: 35 Session Start time: 5:17  Session End time: 6:01 Total time: 81  Referring Provider: Candida Peeling, NP Type of Visit: Video Patient/Family location: Home [facility] Provider location: Remote All persons participating in visit: Pt and [facility]  Confirmed patient's address: Yes  Confirmed patient's phone number: Yes  Any changes to demographics: No   Confirmed patient's insurance: Yes  Any changes to patient's insurance: No   Discussed confidentiality: Yes   I connected with Susan George by a video enabled telemedicine application and verified that I am speaking with the correct person using two identifiers.     I discussed the limitations of evaluation and management by telemedicine and the availability of in person appointments.  I discussed that the purpose of this visit is to provide behavioral health care while limiting exposure to the novel coronavirus.   Discussed there is a possibility of technology failure and discussed alternative modes of communication if that failure occurs.  I discussed that engaging in this video visit, they consent to the provision of behavioral healthcare and the services will be billed under their insurance.  Patient and/or legal guardian expressed understanding and consented to video visit: Yes   PRESENTING CONCERNS: Patient and/or family reports the following symptoms/concerns: Pt reports ongoing frustration and stress with chronic medical condition. Pt reports recent dx of scoliosis, citing mixed feelings. 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 Duration of problem: years; Severity of problem: severe  STRENGTHS (Protective Factors/Coping Skills): Pt able to identify frustrations and stressors Pt with increased  ability to advocate for her needs  GOALS ADDRESSED: Patient will: 1.  Demonstrate ability to: Increase adequate support systems for patient/family  INTERVENTIONS: Interventions utilized:  Brief CBT and Supportive Counseling Standardized Assessments completed: Not Needed  ASSESSMENT: Patient currently experiencing ongoing and elevated anxiety, as well as stress and depression associated with chronic illness.   Patient may benefit from ongoing support from this clinic as well as medical specialists.  PLAN: 1. Follow up with behavioral health clinician on : 02/21/2020 2. Behavioral recommendations: Pt will follow up w/ ortho 3. Referral(s): Integrated [employer] (In Clinic)  I discussed the assessment and treatment plan with the patient and/or parent/guardian. They were provided an opportunity to ask questions and all were answered. They agreed with the plan and demonstrated an understanding of the instructions.   They were advised to call back or seek an in-person evaluation if the symptoms worsen or if the condition fails to improve as anticipated.  [name]

## 2020-02-20 ENCOUNTER — Telehealth: Payer: Self-pay

## 2020-02-20 MED ORDER — CYCLOBENZAPRINE HCL 10 MG PO TABS
10.00 | ORAL_TABLET | ORAL | Status: DC
Start: ? — End: 2020-02-20

## 2020-02-20 MED ORDER — ENOXAPARIN SODIUM 40 MG/0.4ML ~~LOC~~ SOLN
40.00 | SUBCUTANEOUS | Status: DC
Start: 2020-02-22 — End: 2020-02-20

## 2020-02-20 MED ORDER — GABAPENTIN 400 MG PO CAPS
800.00 | ORAL_CAPSULE | ORAL | Status: DC
Start: 2020-02-21 — End: 2020-02-20

## 2020-02-20 MED ORDER — HYDROXYZINE PAMOATE 25 MG PO CAPS
25.00 | ORAL_CAPSULE | ORAL | Status: DC
Start: ? — End: 2020-02-20

## 2020-02-20 MED ORDER — BUSPIRONE HCL 10 MG PO TABS
10.00 | ORAL_TABLET | ORAL | Status: DC
Start: 2020-02-21 — End: 2020-02-20

## 2020-02-20 MED ORDER — ACETAMINOPHEN 325 MG PO TABS
650.00 | ORAL_TABLET | ORAL | Status: DC
Start: ? — End: 2020-02-20

## 2020-02-20 MED ORDER — SENNOSIDES-DOCUSATE SODIUM 8.6-50 MG PO TABS
1.00 | ORAL_TABLET | ORAL | Status: DC
Start: ? — End: 2020-02-20

## 2020-02-20 MED ORDER — LIDOCAINE HCL 1 % IJ SOLN
0.50 | INTRAMUSCULAR | Status: DC
Start: ? — End: 2020-02-20

## 2020-02-20 MED ORDER — POLYETHYLENE GLYCOL 3350 17 GM/SCOOP PO POWD
17.00 | ORAL | Status: DC
Start: 2020-02-22 — End: 2020-02-20

## 2020-02-20 MED ORDER — LIDOCAINE 5 % EX PTCH
2.00 | MEDICATED_PATCH | CUTANEOUS | Status: DC
Start: 2020-02-22 — End: 2020-02-20

## 2020-02-20 NOTE — Telephone Encounter (Signed)
Pre-screening for onsite visit  1. Who is bringing the patient to the visit? Patient  Informed only one adult can bring patient to the visit to limit possible exposure to COVID19 and facemasks must be worn while in the building by the patient (ages 2 and older) and adult.  2. Has the person bringing the patient or the patient been around anyone with suspected or confirmed COVID-19 in the last 14 days? No   3. Has the person bringing the patient or the patient been around anyone who has been tested for COVID-19 in the last 14 days? No  4. Has the person bringing the patient or the patient had any of these symptoms in the last 14 days? No  Fever (temp 100 F or higher) Breathing problems Cough Sore throat Body aches Chills Vomiting Diarrhea   If all answers are negative, advise patient to call our office prior to your appointment if you or the patient develop any of the symptoms listed above.   If any answers are yes, cancel in-office visit and schedule the patient for a same day telehealth visit with a provider to discuss the next steps. 

## 2020-02-21 ENCOUNTER — Other Ambulatory Visit: Payer: Self-pay | Admitting: Family

## 2020-02-21 ENCOUNTER — Ambulatory Visit (INDEPENDENT_AMBULATORY_CARE_PROVIDER_SITE_OTHER): Payer: BC Managed Care – PPO | Admitting: Licensed Clinical Social Worker

## 2020-02-21 DIAGNOSIS — G479 Sleep disorder, unspecified: Secondary | ICD-10-CM | POA: Diagnosis not present

## 2020-02-21 DIAGNOSIS — G894 Chronic pain syndrome: Secondary | ICD-10-CM | POA: Diagnosis not present

## 2020-02-21 DIAGNOSIS — F064 Anxiety disorder due to known physiological condition: Secondary | ICD-10-CM

## 2020-02-21 NOTE — BH Specialist Note (Signed)
Integrated Behavioral Health via Telemedicine Video Visit  02/21/2020 Susan George [id number]  Number of Integrated Behavioral Health visits: 36 Session Start time: 5:03  Session End time: 5:28 Total time: 25  Referring Provider: Candida Peeling, NP Type of Visit: Video Patient/Family location: [facility] Provider location: [facility] All persons participating in visit: Pt and [facility]  Confirmed patient's address: Yes  Confirmed patient's phone number: Yes  Any changes to demographics: No   Confirmed patient's insurance: Yes  Any changes to patient's insurance: No   Discussed confidentiality: Yes   I connected with Susan George by a video enabled telemedicine application and verified that I am speaking with the correct person using two identifiers.     I discussed the limitations of evaluation and management by telemedicine and the availability of in person appointments.  I discussed that the purpose of this visit is to provide behavioral health care while limiting exposure to the novel coronavirus.   Discussed there is a possibility of technology failure and discussed alternative modes of communication if that failure occurs.  I discussed that engaging in this video visit, they consent to the provision of behavioral healthcare and the services will be billed under their insurance.  Patient and/or legal guardian expressed understanding and consented to video visit: Yes   PRESENTING CONCERNS: Patient and/or family reports the following symptoms/concerns: Pt reports recent extended stay in the hospital, following a seizure episode. Pt reports feeling stressed about missed work and school while in the hospital, while also feeling glad that she and her doctors have more information about her experience. Pt reports feeling anxious in the hospital, was able to use relaxation strategies and coping skills. Pt reports advice given in the hospital was to schedule follow ups with her specialty providers,  and that CBT would be a good fit for pt. Duration of problem: years; Severity of problem: severe  STRENGTHS (Protective Factors/Coping Skills): Pt is able to voice her experience Pt interested in seeking specialist support  GOALS ADDRESSED: Patient will: 1.  Demonstrate ability to: Increase adequate support systems for patient/family  INTERVENTIONS: Interventions utilized:  Supportive Counseling Standardized Assessments completed: Not Needed  ASSESSMENT: Patient currently experiencing ongoing mental, emotional, and physical pain and difficulties. Pt experiencing ongoing frustration about the difficulty accessing care she needs.   Patient may benefit from more intensive support from a clinic in her area.  PLAN: 1. Follow up with behavioral health clinician on : 02/28/2020 2. Behavioral recommendations: Pt will make f/u appts w/ med team, will look into in-network CBT therapists in [place] 3. Referral(s): [facility] [facility] (LME/Outside Clinic) and Psychiatrist  I discussed the assessment and treatment plan with the patient and/or parent/guardian. They were provided an opportunity to ask questions and all were answered. They agreed with the plan and demonstrated an understanding of the instructions.   They were advised to call back or seek an in-person evaluation if the symptoms worsen or if the condition fails to improve as anticipated.  [name]

## 2020-02-28 ENCOUNTER — Ambulatory Visit (INDEPENDENT_AMBULATORY_CARE_PROVIDER_SITE_OTHER): Payer: BC Managed Care – PPO | Admitting: Licensed Clinical Social Worker

## 2020-02-28 DIAGNOSIS — G479 Sleep disorder, unspecified: Secondary | ICD-10-CM

## 2020-02-28 DIAGNOSIS — G894 Chronic pain syndrome: Secondary | ICD-10-CM

## 2020-02-28 DIAGNOSIS — F064 Anxiety disorder due to known physiological condition: Secondary | ICD-10-CM | POA: Diagnosis not present

## 2020-02-28 NOTE — BH Specialist Note (Signed)
Integrated Behavioral Health via Telemedicine Video Visit  02/28/2020 Tonni Mansour [id number]  Number of Integrated Behavioral Health visits: 37 Session Start time: 5:17  Session End time: 5:48 Total time: 31  Referring Provider: Candida Peeling, NP Type of Visit: Video Patient/Family location: Home [facility] Provider location: [facility] All persons participating in visit: Pt and [facility]  Confirmed patient's address: Yes  Confirmed patient's phone number: Yes  Any changes to demographics: No   Confirmed patient's insurance: Yes  Any changes to patient's insurance: No   Discussed confidentiality: Yes   I connected with [name] by a video enabled telemedicine application and verified that I am speaking with the correct person using two identifiers.     I discussed the limitations of evaluation and management by telemedicine and the availability of in person appointments.  I discussed that the purpose of this visit is to provide behavioral health care while limiting exposure to the novel coronavirus.   Discussed there is a possibility of technology failure and discussed alternative modes of communication if that failure occurs.  I discussed that engaging in this video visit, they consent to the provision of behavioral healthcare and the services will be billed under their insurance.  Patient and/or legal guardian expressed understanding and consented to video visit: Yes   PRESENTING CONCERNS: Patient and/or family reports the following symptoms/concerns: Pt reports having continued physical pains as well as anxieties about work and school. Duration of problem: years; Severity of problem: severe  STRENGTHS (Protective Factors/Coping Skills): Pt is increasingly vocal in advocating for herself Pt has a team of medical specialists  GOALS ADDRESSED: Patient will: 1.  Demonstrate ability to: Increase adequate support systems for patient/family  INTERVENTIONS: Interventions utilized:   Supportive Counseling   Active listening  Validation  Summarization Standardized Assessments completed: Not Needed  ASSESSMENT: Patient currently experiencing ongoing difficulty with chronic medical conditions.   Patient may benefit from CBT in her local area.  PLAN: 1. Follow up with behavioral health clinician on : 03/04/2020 2. Behavioral recommendations: Pt will call psychiatric office to confirm appt, and to ask about CBT availability 3. Referral(s): Pt has appt scheduled w/ psychiatry in her area  I discussed the assessment and treatment plan with the patient and/or parent/guardian. They were provided an opportunity to ask questions and all were answered. They agreed with the plan and demonstrated an understanding of the instructions.   They were advised to call back or seek an in-person evaluation if the symptoms worsen or if the condition fails to improve as anticipated.  [name]

## 2020-03-04 ENCOUNTER — Encounter: Payer: BC Managed Care – PPO | Admitting: Licensed Clinical Social Worker

## 2020-03-06 ENCOUNTER — Ambulatory Visit (INDEPENDENT_AMBULATORY_CARE_PROVIDER_SITE_OTHER): Payer: BC Managed Care – PPO | Admitting: Licensed Clinical Social Worker

## 2020-03-06 DIAGNOSIS — F064 Anxiety disorder due to known physiological condition: Secondary | ICD-10-CM | POA: Diagnosis not present

## 2020-03-06 DIAGNOSIS — G894 Chronic pain syndrome: Secondary | ICD-10-CM

## 2020-03-06 NOTE — BH Specialist Note (Signed)
Integrated Behavioral Health via Telemedicine Video Visit  03/06/2020 Lucylle Foulkes [id number]  Number of Integrated Behavioral Health visits: 44 Session Start time: 5:15  Session End time: 5:43 Total time: 28  Referring Provider: Candida Peeling, NP Type of Visit: Video Patient/Family location: Home [facility] Provider location: [facility] All persons participating in visit: Pt and [facility]  Confirmed patient's address: Yes  Confirmed patient's phone number: Yes  Any changes to demographics: No   Confirmed patient's insurance: Yes  Any changes to patient's insurance: No   Discussed confidentiality: Yes   I connected with [name] by a video enabled telemedicine application and verified that I am speaking with the correct person using two identifiers.     I discussed the limitations of evaluation and management by telemedicine and the availability of in person appointments.  I discussed that the purpose of this visit is to provide behavioral health care while limiting exposure to the novel coronavirus.   Discussed there is a possibility of technology failure and discussed alternative modes of communication if that failure occurs.  I discussed that engaging in this video visit, they consent to the provision of behavioral healthcare and the services will be billed under their insurance.  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 Pt also reports feeling uncomfortable about asking others for help, and worries about the perceptions of others. Duration of problem: years; Severity of problem: severe  STRENGTHS (Protective Factors/Coping Skills): Pt more involved in her care team  GOALS ADDRESSED: Patient will: 1.  Demonstrate ability to: Increase adequate support systems for  patient/family  INTERVENTIONS: Interventions utilized:  Supportive Counseling Standardized Assessments completed: Not Needed  ASSESSMENT: Patient currently experiencing ongoing stress and anxiety, as evidenced by pt's report. Pt experiencing chronic pain and illness.   Patient may benefit from further bridge support from this clinic, and to get connected w/ a CBT counselor.  PLAN: 1. Follow up with behavioral health clinician on : 03/17/20 2. Behavioral recommendations: pt will keep appts w/ care team, pt will allow others to help her when needed 3. Referral(s): Care team  I discussed the assessment and treatment plan with the patient and/or parent/guardian. They were provided an opportunity to ask questions and all were answered. They agreed with the plan and demonstrated an understanding of the instructions.   They were advised to call back or seek an in-person evaluation if the symptoms worsen or if the condition fails to improve as anticipated.  [name]

## 2020-03-17 ENCOUNTER — Encounter: Payer: BC Managed Care – PPO | Admitting: Licensed Clinical Social Worker

## 2020-03-17 NOTE — BH Specialist Note (Signed)
Opened in error, pt rescheduled, closing for admin purposes

## 2020-03-26 ENCOUNTER — Ambulatory Visit (INDEPENDENT_AMBULATORY_CARE_PROVIDER_SITE_OTHER): Payer: BC Managed Care – PPO | Admitting: Licensed Clinical Social Worker

## 2020-03-26 DIAGNOSIS — F064 Anxiety disorder due to known physiological condition: Secondary | ICD-10-CM

## 2020-03-26 DIAGNOSIS — G894 Chronic pain syndrome: Secondary | ICD-10-CM

## 2020-03-26 NOTE — BH Specialist Note (Signed)
Integrated Behavioral Health via Telemedicine Video Visit  03/26/2020 Zarah Carbon [id number]  Number of Integrated Behavioral Health visits: 39 Session Start time: 5:00  Session End time: 5:38 Total time: 88  Referring Provider: Candida Peeling, NP Type of Visit: Video Patient/Family location: Home [facility] Provider location: Remote All persons participating in visit: Pt and [facility]  Confirmed patient's address: Yes  Confirmed patient's phone number: Yes  Any changes to demographics: No   Confirmed patient's insurance: Yes  Any changes to patient's insurance: No   Discussed confidentiality: Yes   I connected with [name] by a video enabled telemedicine application and verified that I am speaking with the correct person using two identifiers.     I discussed the limitations of evaluation and management by telemedicine and the availability of in person appointments.  I discussed that the purpose of this visit is to provide behavioral health care while limiting exposure to the novel coronavirus.   Discussed there is a possibility of technology failure and discussed alternative modes of communication if that failure occurs.  I discussed that engaging in this video visit, they consent to the provision of behavioral healthcare and the services will be billed under their insurance.  Patient and/or legal guardian expressed understanding and consented to video visit: Yes   PRESENTING CONCERNS: Patient and/or family reports the following symptoms/concerns: Pt reports feeling overwhelmed and frustrated about the future and feeling unprepared for upcoming changes Duration of problem: years; Severity of problem: severe  STRENGTHS (Protective Factors/Coping Skills): Pt is engaged in health care, has several specialists  GOALS ADDRESSED: Patient will: 1.  Demonstrate ability to: Increase adequate support systems for patient/family  INTERVENTIONS: Interventions utilized:  Supportive  Counseling Standardized Assessments completed: Not Needed  ASSESSMENT: Patient currently experiencing ongoing stress, anxiety, and depression related to chronic illness and concern about life changes.   Patient may benefit from continued care from her specialist practitioners, as well as keeping appt w/ psychiatrist.  PLAN: 1. Follow up with behavioral health clinician on : 05/08/20 2. Behavioral recommendations: Pt will reach out to administration about service animals and lateral entry requirements 3. Referral(s): Integrated [employer] (In Clinic) and Psychiatrist  I discussed the assessment and treatment plan with the patient and/or parent/guardian. They were provided an opportunity to ask questions and all were answered. They agreed with the plan and demonstrated an understanding of the instructions.   They were advised to call back or seek an in-person evaluation if the symptoms worsen or if the condition fails to improve as anticipated.  [name]

## 2020-04-08 ENCOUNTER — Encounter: Payer: BC Managed Care – PPO | Admitting: Licensed Clinical Social Worker

## 2020-04-15 ENCOUNTER — Encounter: Payer: BC Managed Care – PPO | Admitting: Licensed Clinical Social Worker

## 2020-04-15 MED ORDER — GLUCOSE-VITAMIN C 4-6 GM-MG PO CHEW
16.00 | CHEWABLE_TABLET | ORAL | Status: DC
Start: ? — End: 2020-04-15

## 2020-04-15 MED ORDER — DEXTROSE 50 % IV SOLN
25.00 | INTRAVENOUS | Status: DC
Start: ? — End: 2020-04-15

## 2020-04-15 MED ORDER — BISACODYL 5 MG PO TBEC
10.00 | DELAYED_RELEASE_TABLET | ORAL | Status: DC
Start: ? — End: 2020-04-15

## 2020-04-15 MED ORDER — LORAZEPAM 2 MG/ML IJ SOLN
2.00 | INTRAMUSCULAR | Status: DC
Start: ? — End: 2020-04-15

## 2020-04-15 MED ORDER — ONDANSETRON 4 MG PO TBDP
4.00 | ORAL_TABLET | ORAL | Status: DC
Start: ? — End: 2020-04-15

## 2020-04-15 MED ORDER — GLUCAGON (RDNA) 1 MG IJ KIT
1.00 | PACK | INTRAMUSCULAR | Status: DC
Start: ? — End: 2020-04-15

## 2020-04-15 MED ORDER — GABAPENTIN 400 MG PO CAPS
800.00 | ORAL_CAPSULE | ORAL | Status: DC
Start: 2020-04-15 — End: 2020-04-15

## 2020-04-15 MED ORDER — LAMOTRIGINE 25 MG PO TABS
25.00 | ORAL_TABLET | ORAL | Status: DC
Start: 2020-04-15 — End: 2020-04-15

## 2020-04-15 MED ORDER — GENERIC EXTERNAL MEDICATION
Status: DC
Start: 2020-04-15 — End: 2020-04-15

## 2020-04-15 MED ORDER — BUSPIRONE HCL 5 MG PO TABS
10.00 | ORAL_TABLET | ORAL | Status: DC
Start: 2020-04-15 — End: 2020-04-15

## 2020-04-15 MED ORDER — LORATADINE 10 MG PO TABS
10.00 | ORAL_TABLET | ORAL | Status: DC
Start: 2020-04-16 — End: 2020-04-15

## 2020-04-22 ENCOUNTER — Ambulatory Visit (INDEPENDENT_AMBULATORY_CARE_PROVIDER_SITE_OTHER): Payer: BC Managed Care – PPO | Admitting: Licensed Clinical Social Worker

## 2020-04-22 DIAGNOSIS — G894 Chronic pain syndrome: Secondary | ICD-10-CM | POA: Diagnosis not present

## 2020-04-22 DIAGNOSIS — F064 Anxiety disorder due to known physiological condition: Secondary | ICD-10-CM

## 2020-04-23 NOTE — BH Specialist Note (Signed)
Integrated Behavioral Health via Telemedicine Video Visit  04/23/2020 Ashantia Amaral [id number]  Number of Integrated Behavioral Health visits: 40 Session Start time: 5:20  Session End time: 5:58 Total time: 17  Referring Provider: Candida Peeling, NP Type of Visit: Video Patient/Family location: Home [facility] Provider location: Remote All persons participating in visit: Pt and [facility]  Confirmed patient's address: Yes  Confirmed patient's phone number: Yes  Any changes to demographics: No   Confirmed patient's insurance: Yes  Any changes to patient's insurance: No   Discussed confidentiality: Yes   I connected with [name] by a video enabled telemedicine application and verified that I am speaking with the correct person using two identifiers.     I discussed the limitations of evaluation and management by telemedicine and the availability of in person appointments.  I discussed that the purpose of this visit is to provide behavioral health care while limiting exposure to the novel coronavirus.   Discussed there is a possibility of technology failure and discussed alternative modes of communication if that failure occurs.  I discussed that engaging in this video visit, they consent to the provision of behavioral healthcare and the services will be billed under their insurance.  Patient and/or legal guardian expressed understanding and consented to video visit: Yes   PRESENTING CONCERNS: Patient and/or family reports the following symptoms/concerns: Pt reports recent hospitalization as a result of seizure. Pt reports having made changes to medications and specialists, feels more comfortable telling people what she wants/needs. Pt has also had initial appt w/ psychiatrist and counselor, is expecting a call back from them in the week to schedule follow ups with both Duration of problem: years; Severity of problem: severe  STRENGTHS (Protective Factors/Coping Skills): Pt able to express her  needs  GOALS ADDRESSED: Patient will: 1.  Demonstrate ability to: Increase adequate support systems for patient/family  INTERVENTIONS: Interventions utilized:  Supportive Counseling Standardized Assessments completed: Not Needed  ASSESSMENT: Patient currently experiencing ongoing stress and depression related to chronic medical condition.   Patient may benefit from bridge support from this clinic, as well as ongoing support from care team.  PLAN: 1. Follow up with behavioral health clinician on : 04/29/20 2. Behavioral recommendations: Pt will follow up w/ appts w/ psychiatry and counseling; [facility] to admin PHQ-SADS at follow up 3. Referral(s): Integrated [employer] (In Clinic), [facility] ([facility]Outside Clinic) and Psychiatrist  I discussed the assessment and treatment plan with the patient and/or parent/guardian. They were provided an opportunity to ask questions and all were answered. They agreed with the plan and demonstrated an understanding of the instructions.   They were advised to call back or seek an in-person evaluation if the symptoms worsen or if the condition fails to improve as anticipated.  [name]

## 2020-04-25 ENCOUNTER — Other Ambulatory Visit: Payer: Self-pay | Admitting: Family

## 2020-04-25 DIAGNOSIS — F064 Anxiety disorder due to known physiological condition: Secondary | ICD-10-CM

## 2020-04-29 ENCOUNTER — Other Ambulatory Visit: Payer: Self-pay

## 2020-04-29 ENCOUNTER — Encounter: Payer: BC Managed Care – PPO | Admitting: Licensed Clinical Social Worker

## 2020-05-07 ENCOUNTER — Encounter: Payer: BC Managed Care – PPO | Admitting: Licensed Clinical Social Worker

## 2020-05-13 ENCOUNTER — Ambulatory Visit (INDEPENDENT_AMBULATORY_CARE_PROVIDER_SITE_OTHER): Payer: BC Managed Care – PPO | Admitting: Licensed Clinical Social Worker

## 2020-05-13 DIAGNOSIS — F064 Anxiety disorder due to known physiological condition: Secondary | ICD-10-CM

## 2020-05-13 DIAGNOSIS — G894 Chronic pain syndrome: Secondary | ICD-10-CM

## 2020-05-13 NOTE — BH Specialist Note (Signed)
Integrated Behavioral Health via Telemedicine Video Visit  05/13/2020 Liani Caris [id number]  Number of Integrated Behavioral Health visits: 41 Session Start time: 5:30  Session End time: 5:42 Total time: 12; No charge for this visit due to brief length of time.   Referring Provider: Candida Peeling, NP Type of Visit: Video Patient/Family location: Home [facility] Provider location: Remote All persons participating in visit: Pt and [facility]  Confirmed patient's address: Yes  Confirmed patient's phone number: Yes  Any changes to demographics: No   Confirmed patient's insurance: Yes  Any changes to patient's insurance: No   Discussed confidentiality: Yes   I connected with [name] by a video enabled telemedicine application and verified that I am speaking with the correct person using two identifiers.     I discussed the limitations of evaluation and management by telemedicine and the availability of in person appointments.  I discussed that the purpose of this visit is to provide behavioral health care while limiting exposure to the novel coronavirus.   Discussed there is a possibility of technology failure and discussed alternative modes of communication if that failure occurs.  I discussed that engaging in this video visit, they consent to the provision of behavioral healthcare and the services will be billed under their insurance.  Patient and/or legal guardian expressed understanding and consented to video visit: Yes   PRESENTING CONCERNS: Patient and/or family reports the following symptoms/concerns: Pt reports feeling stressed about upcoming changes. 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 Pt is not sure what it will mean for her healthcare and coverage. Pt reports not having been back to the psychiatrist or associated counselor since initial meeting, states that she will call them back in the next  week. Duration of problem: years; Severity of problem: severe  STRENGTHS (Protective Factors/Coping Skills): Pt with interest in taking more charge of her health care  GOALS ADDRESSED: Patient will: 1.  Demonstrate ability to: Increase adequate support systems for patient/family  INTERVENTIONS: Interventions utilized:  Supportive Counseling Standardized Assessments completed: Not Needed  ASSESSMENT: Patient currently experiencing ongoing stress related to her medical condition.   Patient may benefit from following up with approprite local support.  PLAN: 1. Follow up with behavioral health clinician on : 05/27/20 2. Behavioral recommendations: Pt will reach out to psychiatrist and counselor in her area 3. Referral(s): Integrated [employer] (In Clinic) and [facility] (LME/Outside Clinic)  I discussed the assessment and treatment plan with the patient and/or parent/guardian. They were provided an opportunity to ask questions and all were answered. They agreed with the plan and demonstrated an understanding of the instructions.   They were advised to call back or seek an in-person evaluation if the symptoms worsen or if the condition fails to improve as anticipated.  [name]

## 2020-05-27 ENCOUNTER — Ambulatory Visit (INDEPENDENT_AMBULATORY_CARE_PROVIDER_SITE_OTHER): Payer: BC Managed Care – PPO | Admitting: Licensed Clinical Social Worker

## 2020-05-27 ENCOUNTER — Other Ambulatory Visit: Payer: Self-pay

## 2020-05-27 DIAGNOSIS — F064 Anxiety disorder due to known physiological condition: Secondary | ICD-10-CM

## 2020-05-27 NOTE — BH Specialist Note (Signed)
Integrated Behavioral Health via Telemedicine Video Visit  05/27/2020 Susan [place] [id number]  Number of Integrated Behavioral Health visits: 41 Session Start time: 4:38  Session End time: 4:55 Total time: 17  Referring Provider: Candida Peeling, NP Type of Visit: Video Patient/Family location: [place] Provider location: [place] All persons participating in visit: Pt and Susan [place]  Confirmed patient's address: Yes  Confirmed patient's phone number: Yes  Any changes to demographics: No   Confirmed patient's insurance: Yes  Any changes to patient's insurance: No   Discussed confidentiality: Yes   I connected with Susan George by a video enabled telemedicine application and verified that I am speaking with the correct person using two identifiers.     I discussed the limitations of evaluation and management by telemedicine and the availability of in person appointments.  I discussed that the purpose of this visit is to provide behavioral health care while limiting exposure to the novel coronavirus.   Discussed there is a possibility of technology failure and discussed alternative modes of communication if that failure occurs.  I discussed that engaging in this video visit, they consent to the provision of behavioral healthcare and the services will be billed under their insurance.  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 Pt reports that she doesn't feel like she gets a break because she is taking summer classes. Pt reports sometimes receiving conflicting advice from various specialists, feels stressed out about that. Pt reports not yet having been able to call community counselor Duration of problem: years; Severity of problem: severe   GOALS ADDRESSED: Patient will: 1.   Demonstrate ability to: Increase adequate support systems for patient/family  INTERVENTIONS: Interventions utilized:  Supportive Counseling Standardized Assessments completed: Not Needed  ASSESSMENT: Patient currently experiencing ongoing stress and anxiety related to chronic pain.   Patient may benefit from following up w/ community OPT.  PLAN: 1. Follow up with behavioral health clinician on : 06/18/20 2. Behavioral recommendations: Pt will call counselor to schedule follow up appt; pt will try to say no 3. Referral(s): Integrated [employer] (In [place]) and [facility] ([facility]/Outside [place])  I discussed the assessment and treatment plan with the patient and/or parent/guardian. They were provided an opportunity to ask questions and all were answered. They agreed with the plan and demonstrated an understanding of the instructions.   They were advised to call back or seek an in-person evaluation if the symptoms worsen or if the condition fails to improve as anticipated.  [name]

## 2020-06-18 ENCOUNTER — Ambulatory Visit (INDEPENDENT_AMBULATORY_CARE_PROVIDER_SITE_OTHER): Payer: BC Managed Care – PPO | Admitting: Licensed Clinical Social Worker

## 2020-06-18 ENCOUNTER — Other Ambulatory Visit: Payer: Self-pay

## 2020-06-18 DIAGNOSIS — G894 Chronic pain syndrome: Secondary | ICD-10-CM

## 2020-06-18 DIAGNOSIS — F064 Anxiety disorder due to known physiological condition: Secondary | ICD-10-CM | POA: Diagnosis not present

## 2020-06-18 NOTE — BH Specialist Note (Signed)
Integrated Behavioral Health via Telemedicine Video (Caregility) Visit  06/18/2020 Susan [place] [id number]  Number of Integrated Behavioral Health visits: 42 Session Start time: 2:16  Session End time: 2:40 Total time: 24  Referring Provider: Candida Peeling, NP Type of Visit: Video Patient/Family location: Home [place] [facility] Provider location: [facility] All persons participating in visit: Pt and [facility]  Confirmed patient's address: Yes  Confirmed patient's phone number: Yes  Any changes to demographics: No   Confirmed patient's insurance: Yes  Any changes to patient's insurance: No   Discussed confidentiality: Yes   I connected with [name] by a video enabled telemedicine application (Caregility) and verified that I am speaking with the correct person using two identifiers.     I discussed the limitations of evaluation and management by telemedicine and the availability of in person appointments.  I discussed that the purpose of this visit is to provide behavioral health care while limiting exposure to the novel coronavirus.   Discussed there is a possibility of technology failure and discussed alternative modes of communication if that failure occurs.  I discussed that engaging in this virtual visit, they consent to the provision of behavioral healthcare and the services will be billed under their insurance.  Patient and/or legal guardian expressed understanding and consented to virtual visit: Yes   PRESENTING CONCERNS: Patient and/or family reports the following symptoms/concerns: Pt reports ongoing medical concerns, with difficulty navigation medical referrals. Pt reports having been able to advocate for her wants and needs. Pt reports having had two appts w/ CBT counselor, and is feeling good about the connection Duration of problem: years; Severity of problem: severe  STRENGTHS (Protective Factors/Coping Skills): Pt increasingly more involved in her health care Pt w/ care team  GOALS  ADDRESSED: Patient will: 1.  Demonstrate ability to: Increase adequate support systems for patient/family  INTERVENTIONS: Interventions utilized:  Supportive Counseling Standardized Assessments completed: None at this time  ASSESSMENT: Patient currently experiencing ongoing stress, anxiety, and depression, as related to chronic pain.   Patient may benefit from continuing to follow up w/ community OPT.  PLAN: 1. Follow up with behavioral health clinician on : 07/16/20 2. Behavioral recommendations: Pt will call community counselor by the end of the week to schedule a follow up appt 3. Referral(s): Counselor  I discussed the assessment and treatment plan with the patient and/or parent/guardian. They were provided an opportunity to ask questions and all were answered. They agreed with the plan and demonstrated an understanding of the instructions.   They were advised to call back or seek an in-person evaluation if the symptoms worsen or if the condition fails to improve as anticipated.  [name]

## 2020-07-16 ENCOUNTER — Ambulatory Visit (INDEPENDENT_AMBULATORY_CARE_PROVIDER_SITE_OTHER): Payer: BC Managed Care – PPO | Admitting: Licensed Clinical Social Worker

## 2020-07-16 DIAGNOSIS — G894 Chronic pain syndrome: Secondary | ICD-10-CM

## 2020-07-16 DIAGNOSIS — F064 Anxiety disorder due to known physiological condition: Secondary | ICD-10-CM

## 2020-07-17 NOTE — BH Specialist Note (Signed)
Integrated Behavioral Health via Telemedicine Video (Caregility) Visit  07/17/2020 Hali Balgobin [id number]  Number of Integrated Behavioral Health visits: 57 Session Start time: 4:27  Session End time: 4:48 Total time: 21  Referring Provider: Candida Peeling, NP Type of Visit: Video Patient/Family location: Home [facility] Provider location: [facility] All persons participating in visit: Pt and [facility]  Confirmed patient's address: Yes  Confirmed patient's phone number: Yes  Any changes to demographics: No   Confirmed patient's insurance: Yes  Any changes to patient's insurance: No   Discussed confidentiality: Yes   I connected with [name] by a video enabled telemedicine application (Caregility) and verified that I am speaking with the correct person using two identifiers.     I discussed the limitations of evaluation and management by telemedicine and the availability of in person appointments.  I discussed that the purpose of this visit is to provide behavioral health care while limiting exposure to the novel coronavirus.   Discussed there is a possibility of technology failure and discussed alternative modes of communication if that failure occurs.  I discussed that engaging in this virtual visit, they consent to the provision of behavioral healthcare and the services will be billed under their insurance.  Patient and/or legal guardian expressed understanding and consented to virtual visit: Yes   PRESENTING CONCERNS: Patient and/or family reports the following symptoms/concerns: Pt reports ongoing medical concerns, and anxiety and depression related to such. Pt reports that she has had more frequent and regular appointments with the psychiatrist and counselor. Pt reports feeling like the CBT has been helpful, is anxious about discontinuing current counseling relationship, reporting that change is difficult for pt Duration of problem: years; Severity of problem: severe  STRENGTHS  (Protective Factors/Coping Skills): Pt connected to counselor in community  GOALS ADDRESSED: Patient will: 1.  Demonstrate ability to: Increase healthy adjustment to current life circumstances, including making shift from one counseling relationship to another  INTERVENTIONS: Interventions utilized:  Supportive Counseling   [facility] reflected and validated pt's anxieties Standardized Assessments completed: Not Needed  ASSESSMENT: Patient currently experiencing ongoing symptoms of anxiety and depression as related to chronic medical conditions.   Patient may benefit from termination from this clinic, while keeping connection w/ local CBT counselor.  PLAN: 1. Follow up with behavioral health clinician on : 08/14/20 2. Behavioral recommendations: Pt will reflect on and share feelings around termination of counseling relationship 3. Referral(s): Integrated [employer] (In Clinic), Psychiatrist and Counselor  I discussed the assessment and treatment plan with the patient and/or parent/guardian. They were provided an opportunity to ask questions and all were answered. They agreed with the plan and demonstrated an understanding of the instructions.   They were advised to call back or seek an in-person evaluation if the symptoms worsen or if the condition fails to improve as anticipated.  [name]

## 2020-08-14 ENCOUNTER — Ambulatory Visit (INDEPENDENT_AMBULATORY_CARE_PROVIDER_SITE_OTHER): Payer: BC Managed Care – PPO | Admitting: Licensed Clinical Social Worker

## 2020-08-14 DIAGNOSIS — F064 Anxiety disorder due to known physiological condition: Secondary | ICD-10-CM | POA: Diagnosis not present

## 2020-08-14 DIAGNOSIS — G894 Chronic pain syndrome: Secondary | ICD-10-CM

## 2020-08-20 NOTE — BH Specialist Note (Signed)
Integrated Behavioral Health via Telemedicine Video (Caregility) Visit  08/20/2020 Susan [place] [id number]  Number of Integrated Behavioral Health visits: 44 Session Start time: 4:05  Session End time: 4:38 Total time: 33 minutes  Referring Provider: Candida Peeling, NP Type of Visit: Video Patient/Family location: Home [facility] Provider location: [facility] All persons participating in visit: Pt and Susan [place]  Discussed confidentiality: Yes   I connected with Susan [place] by a video enabled telemedicine application (Caregility) and verified that I am speaking with the correct person using two identifiers.    I discussed that engaging in this virtual visit, they consent to the provision of behavioral healthcare and the services will be billed under their insurance.   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 Pt reports feeling more capable of being more involved and vocal about her needs and preferences. Duration of problem: years; Severity of problem: severe  STRENGTHS (Protective Factors/Coping Skills): Pt reaching out to local and specialized health professionals for support  GOALS ADDRESSED: Patient will: 1.  Demonstrate ability to: Increase adequate support systems for patient/family  INTERVENTIONS: Interventions utilized:  Behavioral Activation and Supportive Counseling   Open questions to get a more complete sense of pt's experience  Open questions and behavioral activation around reaching out to local CBT therapist Standardized Assessments completed: Not Needed  ASSESSMENT: Patient currently experiencing ongoing stress and depression as related to chronic health conditions as well as current health  circumstances.   Patient may benefit from continued bridge support from this clinic until more established w/ local CBT therapist.  PLAN: 1. Follow up with behavioral health clinician on : 09/16/20 2. Behavioral recommendations: Pt will reach out to local CBT therapist 3. Referral(s): Counselor  I discussed the assessment and treatment plan with the patient and/or parent/guardian. They were provided an opportunity to ask questions and all were answered. They agreed with the plan and demonstrated an understanding of the instructions.   They were advised to call back or seek an in-person evaluation if the symptoms worsen or if the condition fails to improve as anticipated.   Confirmed patient's address: Yes  Confirmed patient's phone number: Yes  Any changes to demographics: No   Confirmed patient's insurance: Yes  Any changes to patient's insurance: No   I discussed the limitations of evaluation and management by telemedicine and the availability of in person appointments.  I discussed that the purpose of this visit is to provide behavioral health care while limiting exposure to the novel coronavirus.   Discussed there is a possibility of technology failure and discussed alternative modes of communication if that failure occurs.  [name]

## 2020-09-16 ENCOUNTER — Ambulatory Visit (INDEPENDENT_AMBULATORY_CARE_PROVIDER_SITE_OTHER): Payer: BC Managed Care – PPO | Admitting: Licensed Clinical Social Worker

## 2020-09-16 ENCOUNTER — Other Ambulatory Visit: Payer: Self-pay

## 2020-09-16 DIAGNOSIS — F064 Anxiety disorder due to known physiological condition: Secondary | ICD-10-CM | POA: Diagnosis not present

## 2020-09-16 DIAGNOSIS — G894 Chronic pain syndrome: Secondary | ICD-10-CM

## 2020-09-17 NOTE — BH Specialist Note (Signed)
Integrated Behavioral Health via Telemedicine Video (Caregility) Visit  09/17/2020 Susan [place] [id number]  Number of Integrated Behavioral Health visits: 45 Session Start time: 4:30  Session End time: 4:59 Total time: 29 minutes  Referring Provider: Candida Peeling, NP Type of Service: Individual Patient/Family location: Home [facility] Provider location: [facility] All persons participating in visit: Pt and Susan [place]   I connected with Susan [place] by a video enabled telemedicine application (Caregility) and verified that I am speaking with the correct person using two identifiers.   Discussed confidentiality: Yes   Confirmed demographics & insurance:  Yes   I discussed that engaging in this virtual visit, they consent to the provision of behavioral healthcare and the services will be billed under their insurance.   Patient and/or legal guardian expressed understanding and consented to virtual visit: Yes   PRESENTING CONCERNS: Patient and/or family reports the following symptoms/concerns: Pt reports ongoing stress and depression related to medical concerns. Pt reports going regularly but infrequently to see the CBT counselor in her community. Pt reports that the counselor is a part of the practice that pt's psychiatrist is a part of, but that she does not feel as comfortable with the counselor as she does the psychiatrist. Pt is worried to make a change in counselors, is worried about if she will be able to continue to see psychiatrist, and worried about upsetting counselor. Duration of problem: months;depression ongoing; Severity of problem: severe  STRENGTHS (Protective Factors/Coping Skills): Concrete supports in place (healthy food, safe environments, etc.)  ASSESSMENT: Patient currently experiencing ongoing stress and depression related to chronic medical conditions.    GOALS ADDRESSED: Patient will: 1.  Demonstrate ability to: Increase adequate support systems for patient/family    Progress of Goals: Ongoing  INTERVENTIONS: Interventions utilized:  Supportive Counseling and Psychoeducation and/or Health Education   Reflection of feeling  Summarization of larger themes  Discussion of assertive communication Standardized Assessments completed & reviewed: Not Needed   OUTCOME: Patient Response: Pt is hesitant to make changes to care team, but expresses interest in connecting with a different counselor.   PLAN: 1. Follow up with behavioral health clinician on : 10/07/20 2. Behavioral recommendations: Pt will review information about assertive communication 3. Referral(s): Integrated [employer] (In Clinic)  I discussed the assessment and treatment plan with the patient and/or parent/guardian. They were provided an opportunity to ask questions and all were answered. They agreed with the plan and demonstrated an understanding of the instructions.   They were advised to call back or seek an in-person evaluation as appropriate.  I discussed that the purpose of this visit is to provide behavioral health care while limiting exposure to the novel coronavirus.  Discussed there is a possibility of technology failure and discussed alternative modes of communication if that failure occurs.  Susan [place]

## 2020-10-07 ENCOUNTER — Encounter: Payer: BC Managed Care – PPO | Admitting: Licensed Clinical Social Worker

## 2020-10-15 ENCOUNTER — Ambulatory Visit (INDEPENDENT_AMBULATORY_CARE_PROVIDER_SITE_OTHER): Payer: BC Managed Care – PPO | Admitting: Licensed Clinical Social Worker

## 2020-10-15 DIAGNOSIS — F064 Anxiety disorder due to known physiological condition: Secondary | ICD-10-CM

## 2020-10-15 DIAGNOSIS — F3289 Other specified depressive episodes: Secondary | ICD-10-CM | POA: Diagnosis not present

## 2020-10-15 NOTE — BH Specialist Note (Signed)
Integrated Behavioral Health via Telemedicine Video (Caregility) Visit  10/15/2020 Susan [place] [id number]  Number of Integrated Behavioral Health visits: 46 Session Start time: 5:07  Session End time: 5:34 Total time: 27 minutes  Referring Provider: Candida Peeling, NP Type of Service: Individual Patient/Family location: In mom's car [facility] Provider location: [facility] All persons participating in visit: Pt and Susan [place]   I connected with Susan [place] by a video enabled telemedicine application (Caregility) and verified that I am speaking with the correct person using two identifiers.   Discussed confidentiality: Yes   Confirmed demographics & insurance:  Yes   I discussed that engaging in this virtual visit, they consent to the provision of behavioral healthcare and the services will be billed under their insurance.   Patient and/or legal guardian expressed understanding and consented to virtual visit: Yes   PRESENTING CONCERNS: Patient and/or family reports the following symptoms/concerns: Pt reports ongoing depression and anxiety related to chronic illness. Pt reports feeling unheard and discounted by medical professionals Duration of problem: years; Severity of problem: severe  STRENGTHS (Protective Factors/Coping Skills): Concrete supports in place (healthy food, safe environments, etc.)  ASSESSMENT: Patient currently experiencing ongoing anxiety and depression related to chronic illness.    GOALS ADDRESSED: Patient will: 1.  Increase knowledge and/or ability of: assertive communication   Progress of Goals: Ongoing  INTERVENTIONS: Interventions utilized:  Supportive Counseling Standardized Assessments completed & reviewed: Not Needed   OUTCOME: Patient Response: Pt reports that assertive communication feels hard for her. Pt feels unheard by medical team, but is hesitant to speak up for herself   PLAN: 1. Follow up with behavioral health clinician on :  11/11/20 2. Behavioral recommendations: Pt will practice asserting her needs at home w/ family 3. Referral(s): Integrated [employer] (In Clinic), Psychiatrist and Counselor  I discussed the assessment and treatment plan with the patient and/or parent/guardian. They were provided an opportunity to ask questions and all were answered. They agreed with the plan and demonstrated an understanding of the instructions.   They were advised to call back or seek an in-person evaluation as appropriate.  I discussed that the purpose of this visit is to provide behavioral health care while limiting exposure to the novel coronavirus.  Discussed there is a possibility of technology failure and discussed alternative modes of communication if that failure occurs.  Susan [place]

## 2020-11-11 ENCOUNTER — Ambulatory Visit (INDEPENDENT_AMBULATORY_CARE_PROVIDER_SITE_OTHER): Payer: BC Managed Care – PPO | Admitting: Licensed Clinical Social Worker

## 2020-11-11 ENCOUNTER — Other Ambulatory Visit: Payer: Self-pay

## 2020-11-11 DIAGNOSIS — F064 Anxiety disorder due to known physiological condition: Secondary | ICD-10-CM | POA: Diagnosis not present

## 2020-11-11 DIAGNOSIS — G894 Chronic pain syndrome: Secondary | ICD-10-CM | POA: Diagnosis not present

## 2020-11-14 NOTE — BH Specialist Note (Signed)
Integrated Behavioral Health via Telemedicine Video (Caregility) Visit  11/14/2020 Susan [place] [id number]  Number of Integrated Behavioral Health visits: 47 Session Start time: 5:13  Session End time: 5:30 Total time: 17 minutes  Referring Provider: Candida Peeling, NP Type of Service: Individual Patient/Family location: Home [facility] Provider location: [facility] All persons participating in visit: Pt, [facility]   I connected with [name] by a video enabled telemedicine application (Caregility) and verified that I am speaking with the correct person using two identifiers.   Discussed confidentiality: Yes   Confirmed demographics & insurance:  Yes   I discussed that engaging in this virtual visit, they consent to the provision of behavioral healthcare and the services will be billed under their insurance.   Patient and/or legal guardian expressed understanding and consented to virtual visit: Yes   PRESENTING CONCERNS: Patient and/or family reports the following symptoms/concerns: Pt reports ongoing stress related to chronic medical condition Duration of problem: years; Severity of problem: severe  STRENGTHS (Protective Factors/Coping Skills): Social connections and Concrete supports in place (healthy food, safe environments, etc.)  ASSESSMENT: Patient currently experiencing ongoing mood concerns due to medical condition.    GOALS ADDRESSED: Patient will: 1.  Demonstrate ability to: Increase adequate support systems for patient/family   Progress of Goals: Ongoing  INTERVENTIONS: Interventions utilized:  Supportive Counseling Standardized Assessments completed & reviewed: Not Needed   OUTCOME: Patient Response: Pt increasingly frustrated w/ ongoing medical condtion   PLAN: 1. Follow up with behavioral health clinician on : 12/02/20 2. Behavioral recommendations: Pt will reach out to CBT therapist to schedule follow up 3. Referral(s): Integrated [employer] (In  Clinic) and health care teams  I discussed the assessment and treatment plan with the patient and/or parent/guardian. They were provided an opportunity to ask questions and all were answered. They agreed with the plan and demonstrated an understanding of the instructions.   They were advised to call back or seek an in-person evaluation as appropriate.  I discussed that the purpose of this visit is to provide behavioral health care while limiting exposure to the novel coronavirus.  Discussed there is a possibility of technology failure and discussed alternative modes of communication if that failure occurs.  Haynes Hoehn Amit Meloy

## 2020-12-02 ENCOUNTER — Ambulatory Visit (INDEPENDENT_AMBULATORY_CARE_PROVIDER_SITE_OTHER): Payer: BC Managed Care – PPO | Admitting: Licensed Clinical Social Worker

## 2020-12-02 DIAGNOSIS — F3289 Other specified depressive episodes: Secondary | ICD-10-CM | POA: Diagnosis not present

## 2020-12-02 DIAGNOSIS — F064 Anxiety disorder due to known physiological condition: Secondary | ICD-10-CM

## 2020-12-02 DIAGNOSIS — G894 Chronic pain syndrome: Secondary | ICD-10-CM

## 2020-12-02 DIAGNOSIS — F411 Generalized anxiety disorder: Secondary | ICD-10-CM

## 2020-12-04 NOTE — BH Specialist Note (Signed)
Integrated Behavioral Health via Telemedicine Visit  12/04/2020 Susan George [id number]  Number of Integrated Behavioral Health visits: 48 Session Start time: 5:00  Session End time: 5:34 Total time: 89  Referring Provider: Candida Peeling, NP Patient/Family location: Home [facility] Provider location: [facility] All persons participating in visit: Pt and [facility] Types of Service: Individual psychotherapy  I connected with Susan George by Telephone and verified that I am speaking with the correct person using two identifiers.    Discussed confidentiality: Yes   I discussed the limitations of telemedicine and the availability of in person appointments.  Discussed there is a possibility of technology failure and discussed alternative modes of communication if that failure occurs.  I discussed that engaging in this telemedicine visit, they consent to the provision of behavioral healthcare and the services will be billed under their insurance.  Patient and/or legal guardian expressed understanding and consented to Telemedicine visit: Yes   Presenting Concerns: Patient and/or family reports the following symptoms/concerns: Pt reports ongoing stressors and mood difficulties related to chronic medical conditions. Pt reports feeling like her request are often ignored, in regards to safety at school.  Duration of problem: years; Severity of problem: severe  Patient and/or Family's Strengths/Protective Factors: Concrete supports in place (healthy food, safe environments, etc.)  Goals Addressed: Patient will: 1.  Demonstrate ability to: Increase adequate support systems for patient/family  Progress towards Goals: Ongoing  Interventions: Interventions utilized:  Supportive Counseling, Psychoeducation and/or Health Education and Supportive Reflection Standardized Assessments completed: Not Needed  Patient and/or Family Response: Pt more open to suggestion of connection w/ OPT in person and more  regularly  Assessment: Patient currently experiencing anxiety and depression related to chronic medical condition.   Patient may benefit from continuing to see medical professionals, as well as ask counselor at psychiatrist's office for referral for local OPT.  Plan: 1. Follow up with behavioral health clinician on : 01/06/21 2. Behavioral recommendations: Pt will seek referral for local counselor 3. Referral(s): Integrated [facility] (In Clinic) and [facility] (LME/Outside Clinic)  I discussed the assessment and treatment plan with the patient and/or parent/guardian. They were provided an opportunity to ask questions and all were answered. They agreed with the plan and demonstrated an understanding of the instructions.   They were advised to call back or seek an in-person evaluation if the symptoms worsen or if the condition fails to improve as anticipated.  Jama Flavors, [facility]

## 2021-01-06 ENCOUNTER — Encounter: Payer: Self-pay | Admitting: Licensed Clinical Social Worker

## 2021-01-21 ENCOUNTER — Encounter: Payer: Self-pay | Admitting: Licensed Clinical Social Worker

## 2021-01-22 NOTE — BH Specialist Note (Signed)
Opened in error

## 2021-01-27 ENCOUNTER — Other Ambulatory Visit: Payer: Self-pay

## 2021-01-27 ENCOUNTER — Ambulatory Visit (INDEPENDENT_AMBULATORY_CARE_PROVIDER_SITE_OTHER): Payer: BC Managed Care – PPO | Admitting: Licensed Clinical Social Worker

## 2021-01-27 DIAGNOSIS — G894 Chronic pain syndrome: Secondary | ICD-10-CM

## 2021-01-27 DIAGNOSIS — F3289 Other specified depressive episodes: Secondary | ICD-10-CM

## 2021-01-27 DIAGNOSIS — F064 Anxiety disorder due to known physiological condition: Secondary | ICD-10-CM

## 2021-02-01 NOTE — BH Specialist Note (Signed)
Integrated Behavioral Health via Telemedicine Visit  02/01/2021 Susan George [id number]  Number of Integrated Behavioral Health visits: 55 Session Start time: 4:30  Session End time: 4:40 Total time: 10;  No charge for this visit due to brief length of time.  Referring Provider: Candida Peeling, NP Patient/Family location: Mom's car [facility] Provider location: [facility] All persons participating in visit: Pt and [facility] Types of Service: Individual psychotherapy  I connected with Susan George by Video (Caregility application) and verified that I am speaking with the correct person using two identifiers.Discussed confidentiality: Yes   I discussed the limitations of telemedicine and the availability of in person appointments.  Discussed there is a possibility of technology failure and discussed alternative modes of communication if that failure occurs.  I discussed that engaging in this telemedicine visit, they consent to the provision of behavioral healthcare and the services will be billed under their insurance.  Patient and/or legal guardian expressed understanding and consented to Telemedicine visit: Yes   Presenting Concerns: Patient and/or family reports the following symptoms/concerns: Pt reports that she has been able to change her work schedule around to allow for medical appointments during the week to accommodate her need for several different specialist visits. This will also allow for pt to see CBT therapist at pt's psychiatrist's office. Duration of problem: years of health concerns and anxiety; Severity of problem: severe  Patient and/or Family's Strengths/Protective Factors: Social connections, Concrete supports in place (healthy food, safe environments, etc.) and Sense of purpose  Goals Addressed: Patient will: 1.  Demonstrate ability to: Increase adequate support systems for patient/family  Progress towards Goals: Ongoing  Interventions: Interventions utilized:  Supportive  Counseling and Supportive Reflection Standardized Assessments completed: Not Needed  Patient and/or Family Response: Pt reports feeling glad of her decision to make tie for her medical needs during the week. Pt also reports feeling like she will experience a supportive connection w/ in-person CBT therapist, whom she will be able to see regularly now.  Assessment: Patient currently experiencing ongoing anxiety and depression related to chronic illness.   Patient may benefit from continuing to take care of her physical and mental health by maintaining connections w/ appropriate specialists.  Plan: 1. Follow up with behavioral health clinician on : None scheduled 2. Behavioral recommendations: Pt will keep all appts w/ medical and mental health providers 3. Referral(s): Paramedic (LME/Outside Clinic), Psychiatrist and Counselor  I discussed the assessment and treatment plan with the patient and/or parent/guardian. They were provided an opportunity to ask questions and all were answered. They agreed with the plan and demonstrated an understanding of the instructions.   They were advised to call back or seek an in-person evaluation if the symptoms worsen or if the condition fails to improve as anticipated.  Susan George, [facility]
